# Patient Record
Sex: Female | Born: 1935 | Race: White | Hispanic: No | Smoking: Never smoker
Health system: Southern US, Community
[De-identification: ages and names within clinical notes are randomized; demographics above are authoritative.]

## PROBLEM LIST (undated history)

## (undated) DIAGNOSIS — R238 Other skin changes: Secondary | ICD-10-CM

## (undated) DIAGNOSIS — R413 Other amnesia: Secondary | ICD-10-CM

## (undated) DIAGNOSIS — J40 Bronchitis, not specified as acute or chronic: Secondary | ICD-10-CM

## (undated) DIAGNOSIS — K5790 Diverticulosis of intestine, part unspecified, without perforation or abscess without bleeding: Secondary | ICD-10-CM

## (undated) DIAGNOSIS — Z860100 Personal history of colon polyps, unspecified: Secondary | ICD-10-CM

## (undated) DIAGNOSIS — R350 Frequency of micturition: Secondary | ICD-10-CM

## (undated) DIAGNOSIS — Z9889 Other specified postprocedural states: Secondary | ICD-10-CM

## (undated) DIAGNOSIS — R42 Dizziness and giddiness: Secondary | ICD-10-CM

## (undated) DIAGNOSIS — R112 Nausea with vomiting, unspecified: Secondary | ICD-10-CM

## (undated) DIAGNOSIS — H9190 Unspecified hearing loss, unspecified ear: Secondary | ICD-10-CM

## (undated) DIAGNOSIS — C801 Malignant (primary) neoplasm, unspecified: Secondary | ICD-10-CM

## (undated) DIAGNOSIS — M549 Dorsalgia, unspecified: Secondary | ICD-10-CM

## (undated) DIAGNOSIS — F419 Anxiety disorder, unspecified: Secondary | ICD-10-CM

## (undated) DIAGNOSIS — Z8639 Personal history of other endocrine, nutritional and metabolic disease: Secondary | ICD-10-CM

## (undated) DIAGNOSIS — I10 Essential (primary) hypertension: Secondary | ICD-10-CM

## (undated) DIAGNOSIS — E039 Hypothyroidism, unspecified: Secondary | ICD-10-CM

## (undated) DIAGNOSIS — R233 Spontaneous ecchymoses: Secondary | ICD-10-CM

## (undated) HISTORY — PX: EYE SURGERY: SHX253

## (undated) HISTORY — PX: OTHER SURGICAL HISTORY: SHX169

## (undated) HISTORY — PX: TONSILLECTOMY: SUR1361

## (undated) HISTORY — PX: APPENDECTOMY: SHX54

## (undated) HISTORY — PX: CERVICAL FUSION: SHX112

## (undated) HISTORY — PX: CATARACT EXTRACTION, BILATERAL: SHX1313

## (undated) HISTORY — PX: THYROIDECTOMY, PARTIAL: SHX18

---

## 1976-06-08 HISTORY — PX: ABDOMINAL HYSTERECTOMY: SHX81

## 1997-06-08 HISTORY — PX: MASTECTOMY: SHX3

## 1997-09-04 ENCOUNTER — Ambulatory Visit (HOSPITAL_COMMUNITY): Admission: RE | Admit: 1997-09-04 | Discharge: 1997-09-04 | Payer: Self-pay | Admitting: General Surgery

## 1997-10-03 ENCOUNTER — Inpatient Hospital Stay (HOSPITAL_COMMUNITY): Admission: RE | Admit: 1997-10-03 | Discharge: 1997-10-05 | Payer: Self-pay | Admitting: Plastic Surgery

## 1998-08-29 ENCOUNTER — Ambulatory Visit (HOSPITAL_COMMUNITY): Admission: RE | Admit: 1998-08-29 | Discharge: 1998-08-29 | Payer: Self-pay | Admitting: Gastroenterology

## 1999-07-01 ENCOUNTER — Encounter: Admission: RE | Admit: 1999-07-01 | Discharge: 1999-07-01 | Payer: Self-pay | Admitting: Plastic Surgery

## 1999-07-01 ENCOUNTER — Encounter: Payer: Self-pay | Admitting: Plastic Surgery

## 1999-07-02 ENCOUNTER — Ambulatory Visit (HOSPITAL_BASED_OUTPATIENT_CLINIC_OR_DEPARTMENT_OTHER): Admission: RE | Admit: 1999-07-02 | Discharge: 1999-07-02 | Payer: Self-pay | Admitting: Plastic Surgery

## 1999-09-03 ENCOUNTER — Encounter: Payer: Self-pay | Admitting: Neurosurgery

## 1999-09-03 ENCOUNTER — Ambulatory Visit (HOSPITAL_COMMUNITY): Admission: RE | Admit: 1999-09-03 | Discharge: 1999-09-03 | Payer: Self-pay | Admitting: Neurosurgery

## 1999-11-06 ENCOUNTER — Ambulatory Visit (HOSPITAL_COMMUNITY): Admission: RE | Admit: 1999-11-06 | Discharge: 1999-11-06 | Payer: Self-pay | Admitting: Neurosurgery

## 1999-11-06 ENCOUNTER — Encounter: Payer: Self-pay | Admitting: Neurosurgery

## 1999-11-20 ENCOUNTER — Encounter: Payer: Self-pay | Admitting: Neurosurgery

## 1999-11-20 ENCOUNTER — Ambulatory Visit (HOSPITAL_COMMUNITY): Admission: RE | Admit: 1999-11-20 | Discharge: 1999-11-20 | Payer: Self-pay | Admitting: Neurosurgery

## 2000-01-15 ENCOUNTER — Encounter: Payer: Self-pay | Admitting: Neurosurgery

## 2000-01-15 ENCOUNTER — Ambulatory Visit (HOSPITAL_COMMUNITY): Admission: RE | Admit: 2000-01-15 | Discharge: 2000-01-15 | Payer: Self-pay | Admitting: Neurosurgery

## 2000-02-06 ENCOUNTER — Encounter: Payer: Self-pay | Admitting: Neurosurgery

## 2000-02-06 ENCOUNTER — Inpatient Hospital Stay (HOSPITAL_COMMUNITY): Admission: RE | Admit: 2000-02-06 | Discharge: 2000-02-08 | Payer: Self-pay | Admitting: Neurosurgery

## 2000-02-20 ENCOUNTER — Encounter: Payer: Self-pay | Admitting: Neurosurgery

## 2000-02-20 ENCOUNTER — Encounter: Admission: RE | Admit: 2000-02-20 | Discharge: 2000-02-20 | Payer: Self-pay | Admitting: Neurosurgery

## 2000-04-07 ENCOUNTER — Encounter: Admission: RE | Admit: 2000-04-07 | Discharge: 2000-04-07 | Payer: Self-pay | Admitting: Neurosurgery

## 2000-04-07 ENCOUNTER — Encounter: Payer: Self-pay | Admitting: Neurosurgery

## 2000-05-25 ENCOUNTER — Encounter: Payer: Self-pay | Admitting: *Deleted

## 2000-05-25 ENCOUNTER — Inpatient Hospital Stay (HOSPITAL_COMMUNITY): Admission: AD | Admit: 2000-05-25 | Discharge: 2000-05-27 | Payer: Self-pay | Admitting: *Deleted

## 2000-05-27 ENCOUNTER — Encounter: Payer: Self-pay | Admitting: *Deleted

## 2000-07-05 ENCOUNTER — Encounter: Payer: Self-pay | Admitting: Neurosurgery

## 2000-07-05 ENCOUNTER — Encounter: Admission: RE | Admit: 2000-07-05 | Discharge: 2000-07-05 | Payer: Self-pay | Admitting: Neurosurgery

## 2003-03-16 ENCOUNTER — Encounter: Payer: Self-pay | Admitting: Otolaryngology

## 2003-03-16 ENCOUNTER — Ambulatory Visit (HOSPITAL_COMMUNITY): Admission: RE | Admit: 2003-03-16 | Discharge: 2003-03-16 | Payer: Self-pay | Admitting: Otolaryngology

## 2011-08-20 ENCOUNTER — Other Ambulatory Visit: Payer: Self-pay | Admitting: Physician Assistant

## 2011-08-20 MED ORDER — LEVOTHYROXINE SODIUM 75 MCG PO TABS
75.0000 ug | ORAL_TABLET | Freq: Every day | ORAL | Status: DC
Start: 2011-08-20 — End: 2011-08-21

## 2011-08-21 ENCOUNTER — Other Ambulatory Visit: Payer: Self-pay

## 2011-08-21 MED ORDER — LEVOTHYROXINE SODIUM 75 MCG PO TABS: 75.0000 ug | ORAL_TABLET | Freq: Every day | ORAL | Status: DC

## 2012-01-03 ENCOUNTER — Ambulatory Visit: Payer: Medicare Other

## 2012-01-03 ENCOUNTER — Ambulatory Visit (INDEPENDENT_AMBULATORY_CARE_PROVIDER_SITE_OTHER): Payer: Medicare Other | Admitting: Emergency Medicine

## 2012-01-03 VITALS — BP 204/96 | HR 73 | Temp 98.2°F | Resp 20 | Ht 62.5 in | Wt 128.0 lb

## 2012-01-03 DIAGNOSIS — M542 Cervicalgia: Secondary | ICD-10-CM

## 2012-01-03 DIAGNOSIS — E039 Hypothyroidism, unspecified: Secondary | ICD-10-CM

## 2012-01-03 DIAGNOSIS — I10 Essential (primary) hypertension: Secondary | ICD-10-CM

## 2012-01-03 LAB — POCT CBC
Granulocyte percent: 51.5 %G (ref 37–80)
HCT, POC: 48.4 % — AB (ref 37.7–47.9)
Hemoglobin: 14.8 g/dL (ref 12.2–16.2)
Lymph, poc: 2.4 (ref 0.6–3.4)
MCH, POC: 27.7 pg (ref 27–31.2)
MCHC: 30.6 g/dL — AB (ref 31.8–35.4)
MCV: 90.6 fL (ref 80–97)
MID (cbc): 0.4 (ref 0–0.9)
MPV: 9.2 fL (ref 0–99.8)
POC Granulocyte: 2.9 (ref 2–6.9)
POC LYMPH PERCENT: 41.8 %L (ref 10–50)
POC MID %: 6.7 %M (ref 0–12)
Platelet Count, POC: 249 10*3/uL (ref 142–424)
RBC: 5.34 M/uL (ref 4.04–5.48)
RDW, POC: 15.2 %
WBC: 5.7 10*3/uL (ref 4.6–10.2)

## 2012-01-03 MED ORDER — LEVOTHYROXINE SODIUM 75 MCG PO TABS: 75.0000 ug | ORAL_TABLET | Freq: Every day | ORAL | Status: DC

## 2012-01-03 MED ORDER — RAMIPRIL 5 MG PO CAPS: 5.0000 mg | ORAL_CAPSULE | Freq: Every day | ORAL | Status: DC

## 2012-01-03 NOTE — Progress Notes (Signed)
  Subjective:    Patient ID: Sheri Shepherd, female    DOB: 10-16-1932, 78 y.o.   MRN: [id number]  HPI Patient is not taking Altace. Daughter is accompanying Pt today and agrees to set up meds for Pt weekly. Pt states she does take her Synthroid daily. Repeat BP is 180/100. Pt has very limited range of motion with her neck.     Review of Systems     Objective:   Physical Exam        Assessment & Plan:

## 2012-01-03 NOTE — Patient Instructions (Addendum)
Take 1 extra strength Tylenol twice a day. You can try heat and ice to her neck and see if that helps

## 2012-01-03 NOTE — Progress Notes (Signed)
  Subjective:    Patient ID: Sheri Shepherd, female    DOB: 1932-09-08, 78 y.o.   MRN: [id number]  HPI patient here with significant pain and swelling in the back of her neck. She has had 2 neck surgeries in the past. She has not been taking her medications. Normally her daughter puts out her medicines but recently she has been dealing with some family issues has not been able to be sure she is taking her medications appropriately.    Review of Systems     Objective:   Physical Exam there is tenderness over the posterior cervical muscles bilaterally. There is no focal weakness of the upper extremities. Her chest was clear her cardiac exam is unremarkable. She is unable to do rotation of the neck and has very limited flexion extension  Results for orders placed in visit on 01/03/12  POCT CBC      Component Value Range   WBC 5.7  4.6 - 10.2 K/uL   Lymph, poc 2.4  0.6 - 3.4   POC LYMPH PERCENT 41.8  10 - 50 %L   MID (cbc) 0.4  0 - 0.9   POC MID % 6.7  0 - 12 %M   POC Granulocyte 2.9  2 - 6.9   Granulocyte percent 51.5  37 - 80 %G   RBC 5.34  4.04 - 5.48 M/uL   Hemoglobin 14.8  12.2 - 16.2 g/dL   HCT, POC 95.6 (*) 21.3 - 47.9 %   MCV 90.6  80 - 97 fL   MCH, POC 27.7  27 - 31.2 pg   MCHC 30.6 (*) 31.8 - 35.4 g/dL   RDW, POC 08.6     Platelet Count, POC 249  142 - 424 K/uL   MPV 9.2  0 - 99.8 fL   [facility] reading (PRIMARY) by  Dr. Cleta Alberts status post fusion C5-6. There is arthritic change at C6-7 level       Assessment & Plan:  A long discussion that this patient needs to be an assisted-living. I did refill medication her daughters to come visit me to discuss this further.

## 2012-01-04 LAB — COMPREHENSIVE METABOLIC PANEL
ALT: 9 U/L (ref 0–35)
AST: 21 U/L (ref 0–37)
Albumin: 4.1 g/dL (ref 3.5–5.2)
Alkaline Phosphatase: 97 U/L (ref 39–117)
BUN: 15 mg/dL (ref 6–23)
CO2: 27 mEq/L (ref 19–32)
Calcium: 9.6 mg/dL (ref 8.4–10.5)
Chloride: 103 mEq/L (ref 96–112)
Creat: 0.87 mg/dL (ref 0.50–1.10)
Glucose, Bld: 99 mg/dL (ref 70–99)
Potassium: 4.3 mEq/L (ref 3.5–5.3)
Sodium: 140 mEq/L (ref 135–145)
Total Bilirubin: 0.5 mg/dL (ref 0.3–1.2)
Total Protein: 6.8 g/dL (ref 6.0–8.3)

## 2012-01-04 LAB — T4, FREE: Free T4: 0.79 ng/dL — ABNORMAL LOW (ref 0.80–1.80)

## 2012-01-04 LAB — TSH: TSH: 36.368 u[IU]/mL — ABNORMAL HIGH (ref 0.350–4.500)

## 2012-01-06 ENCOUNTER — Telehealth: Payer: Self-pay

## 2012-01-06 MED ORDER — TRAMADOL HCL 50 MG PO TABS: 50.0000 mg | ORAL_TABLET | Freq: Two times a day (BID) | ORAL | Status: AC

## 2012-01-06 MED ORDER — METHOCARBAMOL 500 MG PO TABS: 500.0000 mg | ORAL_TABLET | Freq: Four times a day (QID) | ORAL | Status: AC

## 2012-01-06 NOTE — Telephone Encounter (Signed)
Sent both Rxs to [facility] and notified daughter. D/W need to RTC for recheck if Sxs don't improve. Daughter agreed.

## 2012-01-06 NOTE — Telephone Encounter (Signed)
PATIENT'S DAUGHTER CALLED TO SAY HER MOTHER SAW DR. DAUB ON Sunday. HE REFILLED HER SYNTHROID AND BLOOD PRESSURE MEDICATION, HOWEVER, HE FORGOT TO GIVE HER A MUSCLE RELAXER FOR HER NECK AND (L) ARM PAIN. IT IS REALLY HURTING HER. SHE WOULD LIKE SOMETHING CALLED INTO THE PHARMACY AS SOON AS POSSIBLE FOR HER PLEASE. BEST PHONE [phone] (DAUGHTER [name] VERNON)   PHARMACY CHOICE IS [facility] ([place])   MBC

## 2012-01-06 NOTE — Telephone Encounter (Signed)
We can call in Robaxin 500 mg she can take one every 6 hours as needed for muscle relaxation #40 one refill. We can call in a TRAmadol 50mg  one twice a day for pain. Please call the daughter Garnet Koyanagi and let her know these were called in. Her daughter's number is [phone] pharmacy is [facility] in [place].

## 2012-01-06 NOTE — Telephone Encounter (Signed)
The patient's daughter called again to request something be called into the pharmacy for her mother.  The patient's daughter Rhett Bannister stated that her mother is in extreme neck and left arm pain and really needs to have this taken care of today if at all possible.  Please call the patient's daughter Rhett Bannister at [phone] when Rx has been called into the pharmacy or for any other information needed.

## 2012-01-06 NOTE — Telephone Encounter (Signed)
To MD to advise--- can we rx medicine for pain and a muscle relaxer?

## 2012-01-08 ENCOUNTER — Telehealth: Payer: Self-pay

## 2012-01-08 MED ORDER — OXYCODONE-ACETAMINOPHEN 5-325 MG PO TABS
1.0000 | ORAL_TABLET | Freq: Three times a day (TID) | ORAL | Status: DC | PRN
Start: 2012-01-08 — End: 2012-01-16

## 2012-01-08 NOTE — Telephone Encounter (Signed)
Pts daughter (and POA) [name] Marita [place] calling for mother's pain. States Daub rx'd muscle relaxer and tramadol for a cervical disc issue. States the tramadol plus tylenol is not helping. States in the past dr Cleta Alberts has rx'd percocet and that was helpful for pain management. Can percocet be rx'd again?   [name] [phone]  bf

## 2012-01-08 NOTE — Telephone Encounter (Signed)
Tramadol is not helping with her neck and arm pain. Has history of c spine surgery 10 yrs ago and is requesting Percocet for her pain (helped then). She is c/o severe pain and states Dr Cleta Alberts is very familiar with her. I advised daughter this mssg to be sent to Dr Cleta Alberts. Daughter has asked if Dr Perrin Maltese will advise, since Dr Cleta Alberts is out of office. Please advise.

## 2012-01-08 NOTE — Telephone Encounter (Signed)
Can percocet be prescribed or could you give advice on what we should do.

## 2012-01-08 NOTE — Telephone Encounter (Signed)
Daughter [name] notified that rx is ready for pickup

## 2012-01-08 NOTE — Telephone Encounter (Signed)
Pt daughter Rhett Bannister would like for nurse to contact about her mom chronic pain she states pt has been a pt of Dr Cleta Alberts for along time please contact Mrs. Marita [place] @ [phone]

## 2012-01-08 NOTE — Telephone Encounter (Signed)
Please pull chart and then forward to Dr. Cleta Alberts for review.

## 2012-01-11 ENCOUNTER — Telehealth: Payer: Self-pay

## 2012-01-11 DIAGNOSIS — M542 Cervicalgia: Secondary | ICD-10-CM

## 2012-01-11 NOTE — Telephone Encounter (Signed)
I am keeping this in my box so I can call her mid week and see if appt information rc'd from Dr Venetia Maxon or if I need to follow up/ Dr Cleta Alberts stated he may be able to order scan if we can not get in quickly with Dr Venetia Maxon.

## 2012-01-11 NOTE — Telephone Encounter (Signed)
Have spoken to [facility] pt is to be set up for appointment with Dr Venetia Maxon, and is not getting relief with the Oxycodone. I told her we will try to get the appt set up with Dr Venetia Maxon soon

## 2012-01-11 NOTE — Telephone Encounter (Signed)
FOR DR DAUB ONLY:  [name] WOULD LIKE TO SPEAK WITH YOU REGARDING HER MOM. SHE IS WORRIED ABOUT HER AND DIDN'T KNOW IF THEY NEED TO DO AND MRI HAVE SPOKEN WITH SOME OF THE NURSES HERE, BUT REALLY WOULD LIKE A CALL BACK FROM YOU PLEASE CALL [phone]

## 2012-01-12 NOTE — Telephone Encounter (Signed)
I have spoken to Dr Cleta Alberts and will be a while before the appt with Dr Venetia Maxon can be made. He advised we can proceed with the MRI of her C spine this is ordered for her, daughter [name] advised the percocet helps some she is taking one every 8 hours and would like to get another Rx for this. Please advise. Also would like to know if any studies can be done for her mothers memory issues she is having.

## 2012-01-12 NOTE — Telephone Encounter (Signed)
Let's go 1 step at a time. Okay to wear the MRI of the C-spine. Once this is done if her daughter desires we can refer her to a neurologist to have the testing done for dementia.

## 2012-01-13 ENCOUNTER — Telehealth: Payer: Self-pay | Admitting: Radiology

## 2012-01-13 NOTE — Telephone Encounter (Signed)
Patients daughter was advised of our conversation about neurology and neurosurgeon, her mother is asking for renewal on the Percocet taking 1 tab every 8 hrs and needs renewal please advise.

## 2012-01-13 NOTE — Telephone Encounter (Signed)
Left detailed message for her to advise will take one step at a time and keep this referral in mind. Have advised if further is needed she will call us back.

## 2012-01-14 ENCOUNTER — Other Ambulatory Visit: Payer: Self-pay

## 2012-01-14 ENCOUNTER — Telehealth: Payer: Self-pay | Admitting: Radiology

## 2012-01-14 ENCOUNTER — Emergency Department (HOSPITAL_COMMUNITY): Payer: Medicare Other

## 2012-01-14 ENCOUNTER — Inpatient Hospital Stay (HOSPITAL_COMMUNITY)
Admission: EM | Admit: 2012-01-14 | Discharge: 2012-01-16 | DRG: 392 | Disposition: A | Discharge status: Home / Self Care | Payer: Medicare Other | Attending: Internal Medicine | Admitting: Internal Medicine

## 2012-01-14 ENCOUNTER — Other Ambulatory Visit (HOSPITAL_COMMUNITY): Payer: Medicare Other

## 2012-01-14 ENCOUNTER — Encounter (HOSPITAL_COMMUNITY): Payer: Self-pay | Admitting: Emergency Medicine

## 2012-01-14 DIAGNOSIS — E039 Hypothyroidism, unspecified: Secondary | ICD-10-CM

## 2012-01-14 DIAGNOSIS — Z981 Arthrodesis status: Secondary | ICD-10-CM

## 2012-01-14 DIAGNOSIS — K5790 Diverticulosis of intestine, part unspecified, without perforation or abscess without bleeding: Secondary | ICD-10-CM | POA: Diagnosis present

## 2012-01-14 DIAGNOSIS — R109 Unspecified abdominal pain: Secondary | ICD-10-CM | POA: Diagnosis present

## 2012-01-14 DIAGNOSIS — Z860100 Personal history of colon polyps, unspecified: Secondary | ICD-10-CM | POA: Insufficient documentation

## 2012-01-14 DIAGNOSIS — I10 Essential (primary) hypertension: Secondary | ICD-10-CM | POA: Diagnosis present

## 2012-01-14 DIAGNOSIS — M542 Cervicalgia: Secondary | ICD-10-CM | POA: Diagnosis present

## 2012-01-14 DIAGNOSIS — K5732 Diverticulitis of large intestine without perforation or abscess without bleeding: Principal | ICD-10-CM | POA: Diagnosis present

## 2012-01-14 DIAGNOSIS — R112 Nausea with vomiting, unspecified: Secondary | ICD-10-CM | POA: Diagnosis present

## 2012-01-14 DIAGNOSIS — M4802 Spinal stenosis, cervical region: Secondary | ICD-10-CM

## 2012-01-14 DIAGNOSIS — K5730 Diverticulosis of large intestine without perforation or abscess without bleeding: Secondary | ICD-10-CM

## 2012-01-14 HISTORY — DX: Other specified postprocedural states: Z98.890

## 2012-01-14 HISTORY — DX: Malignant (primary) neoplasm, unspecified: C80.1

## 2012-01-14 HISTORY — DX: Personal history of other endocrine, nutritional and metabolic disease: Z86.39

## 2012-01-14 HISTORY — DX: Personal history of colonic polyps: Z86.010

## 2012-01-14 HISTORY — DX: Essential (primary) hypertension: I10

## 2012-01-14 HISTORY — DX: Nausea with vomiting, unspecified: R11.2

## 2012-01-14 HISTORY — DX: Personal history of colon polyps, unspecified: Z86.0100

## 2012-01-14 HISTORY — DX: Dorsalgia, unspecified: M54.9

## 2012-01-14 HISTORY — DX: Hypothyroidism, unspecified: E03.9

## 2012-01-14 LAB — CBC
MCH: 28.9 pg (ref 26.0–34.0)
MCHC: 33.9 g/dL (ref 30.0–36.0)
MCV: 85.2 fL (ref 78.0–100.0)
Platelets: 213 10*3/uL (ref 150–400)

## 2012-01-14 LAB — URINALYSIS, ROUTINE W REFLEX MICROSCOPIC
Glucose, UA: NEGATIVE mg/dL
Hgb urine dipstick: NEGATIVE
Ketones, ur: NEGATIVE mg/dL
Protein, ur: NEGATIVE mg/dL
Urobilinogen, UA: 1 mg/dL (ref 0.0–1.0)

## 2012-01-14 LAB — BASIC METABOLIC PANEL
Calcium: 9.4 mg/dL (ref 8.4–10.5)
Creatinine, Ser: 0.81 mg/dL (ref 0.50–1.10)
GFR calc non Af Amer: 69 mL/min — ABNORMAL LOW (ref >90)
Glucose, Bld: 144 mg/dL — ABNORMAL HIGH (ref 70–99)
Sodium: 135 mEq/L (ref 135–145)

## 2012-01-14 LAB — LIPASE, BLOOD: Lipase: 20 U/L (ref 11–59)

## 2012-01-14 MED ORDER — ONDANSETRON HCL 4 MG/2ML IJ SOLN
4.0000 mg | Freq: Once | INTRAMUSCULAR | Status: AC
  Administered 2012-01-14: 4 mg via INTRAVENOUS
  Filled 2012-01-14: qty 2

## 2012-01-14 MED ORDER — MORPHINE SULFATE 2 MG/ML IJ SOLN
INTRAMUSCULAR | Status: AC
  Administered 2012-01-14: 2 mg via INTRAVENOUS
  Filled 2012-01-14: qty 1

## 2012-01-14 MED ORDER — ACETAMINOPHEN 325 MG PO TABS
650.0000 mg | ORAL_TABLET | Freq: Four times a day (QID) | ORAL | Status: DC | PRN
  Administered 2012-01-14: 650 mg via ORAL
  Filled 2012-01-14: qty 2

## 2012-01-14 MED ORDER — MORPHINE SULFATE 4 MG/ML IJ SOLN
4.0000 mg | Freq: Once | INTRAMUSCULAR | Status: AC
  Administered 2012-01-14: 4 mg via INTRAVENOUS
  Filled 2012-01-14: qty 1

## 2012-01-14 MED ORDER — BISACODYL 10 MG RE SUPP: 10.0000 mg | Freq: Every day | RECTAL | Status: DC | PRN

## 2012-01-14 MED ORDER — CIPROFLOXACIN IN D5W 400 MG/200ML IV SOLN
400.0000 mg | Freq: Two times a day (BID) | INTRAVENOUS | Status: DC
  Administered 2012-01-14 – 2012-01-16 (×4): 400 mg via INTRAVENOUS
  Filled 2012-01-14 (×4): qty 200

## 2012-01-14 MED ORDER — SODIUM CHLORIDE 0.9 % IV SOLN
INTRAVENOUS | Status: AC
  Administered 2012-01-14: 15:00:00 via INTRAVENOUS

## 2012-01-14 MED ORDER — ONDANSETRON HCL 4 MG/2ML IJ SOLN
4.0000 mg | INTRAMUSCULAR | Status: DC | PRN
  Administered 2012-01-15: 4 mg via INTRAVENOUS
  Filled 2012-01-14: qty 2

## 2012-01-14 MED ORDER — METRONIDAZOLE IN NACL 5-0.79 MG/ML-% IV SOLN
500.0000 mg | Freq: Three times a day (TID) | INTRAVENOUS | Status: DC
  Administered 2012-01-14 – 2012-01-16 (×5): 500 mg via INTRAVENOUS
  Filled 2012-01-14 (×6): qty 100

## 2012-01-14 MED ORDER — MORPHINE SULFATE 2 MG/ML IJ SOLN
1.0000 mg | INTRAMUSCULAR | Status: DC | PRN
  Administered 2012-01-15: 2 mg via INTRAVENOUS
  Administered 2012-01-15: 1 mg via INTRAVENOUS
  Administered 2012-01-15 – 2012-01-16 (×3): 2 mg via INTRAVENOUS
  Filled 2012-01-14 (×5): qty 1

## 2012-01-14 MED ORDER — RAMIPRIL 5 MG PO CAPS
5.0000 mg | ORAL_CAPSULE | Freq: Every day | ORAL | Status: DC
  Administered 2012-01-14 – 2012-01-16 (×3): 5 mg via ORAL
  Filled 2012-01-14 (×3): qty 1

## 2012-01-14 MED ORDER — CIPROFLOXACIN HCL 500 MG PO TABS
500.0000 mg | ORAL_TABLET | Freq: Once | ORAL | Status: AC
  Administered 2012-01-14: 500 mg via ORAL
  Filled 2012-01-14: qty 1

## 2012-01-14 MED ORDER — MORPHINE SULFATE 4 MG/ML IJ SOLN: 4.0000 mg | INTRAMUSCULAR | Status: DC | PRN

## 2012-01-14 MED ORDER — LEVOTHYROXINE SODIUM 75 MCG PO TABS
75.0000 ug | ORAL_TABLET | Freq: Every day | ORAL | Status: DC
  Administered 2012-01-15 – 2012-01-16 (×2): 75 ug via ORAL
  Filled 2012-01-14 (×4): qty 1

## 2012-01-14 MED ORDER — IOHEXOL 300 MG/ML  SOLN
100.0000 mL | Freq: Once | INTRAMUSCULAR | Status: AC | PRN
  Administered 2012-01-14: 100 mL via INTRAVENOUS

## 2012-01-14 MED ORDER — ENOXAPARIN SODIUM 40 MG/0.4ML ~~LOC~~ SOLN
40.0000 mg | SUBCUTANEOUS | Status: DC
  Administered 2012-01-14 – 2012-01-15 (×2): 40 mg via SUBCUTANEOUS
  Filled 2012-01-14 (×3): qty 0.4

## 2012-01-14 MED ORDER — SODIUM CHLORIDE 0.9 % IV SOLN
INTRAVENOUS | Status: DC
Start: 2012-01-14 — End: 2012-01-16
  Administered 2012-01-15: 1000 mL via INTRAVENOUS
  Administered 2012-01-15 – 2012-01-16 (×2): via INTRAVENOUS

## 2012-01-14 MED ORDER — METRONIDAZOLE 500 MG PO TABS
500.0000 mg | ORAL_TABLET | Freq: Once | ORAL | Status: AC
  Administered 2012-01-14: 500 mg via ORAL
  Filled 2012-01-14: qty 1

## 2012-01-14 MED ORDER — ONDANSETRON HCL 4 MG/2ML IJ SOLN
4.0000 mg | Freq: Three times a day (TID) | INTRAMUSCULAR | Status: DC | PRN
  Administered 2012-01-14: 4 mg via INTRAVENOUS
  Filled 2012-01-14: qty 2

## 2012-01-14 MED ORDER — ASPIRIN BUFFERED 325 MG PO TABS: 325.0000 mg | ORAL_TABLET | Freq: Every day | ORAL | Status: DC

## 2012-01-14 MED ORDER — ACETAMINOPHEN 650 MG RE SUPP: 650.0000 mg | Freq: Four times a day (QID) | RECTAL | Status: DC | PRN

## 2012-01-14 MED ORDER — ONDANSETRON HCL 4 MG/2ML IJ SOLN
4.0000 mg | Freq: Once | INTRAMUSCULAR | Status: AC
Start: 2012-01-14 — End: 2012-01-14
  Administered 2012-01-14: 4 mg via INTRAVENOUS
  Filled 2012-01-14: qty 2

## 2012-01-14 MED ORDER — METHOCARBAMOL 500 MG PO TABS
500.0000 mg | ORAL_TABLET | Freq: Three times a day (TID) | ORAL | Status: DC
  Administered 2012-01-15 – 2012-01-16 (×5): 500 mg via ORAL
  Filled 2012-01-14 (×8): qty 1

## 2012-01-14 MED ORDER — SENNA 8.6 MG PO TABS
1.0000 | ORAL_TABLET | Freq: Two times a day (BID) | ORAL | Status: DC
  Administered 2012-01-14 – 2012-01-16 (×4): 8.6 mg via ORAL
  Filled 2012-01-14 (×4): qty 1

## 2012-01-14 MED ORDER — ASPIRIN EC 325 MG PO TBEC
325.0000 mg | DELAYED_RELEASE_TABLET | Freq: Every day | ORAL | Status: DC
  Administered 2012-01-15 – 2012-01-16 (×2): 325 mg via ORAL
  Filled 2012-01-14 (×2): qty 1

## 2012-01-14 MED ORDER — ONDANSETRON HCL 4 MG PO TABS: 4.0000 mg | ORAL_TABLET | ORAL | Status: DC | PRN

## 2012-01-14 NOTE — ED Notes (Signed)
Per EMS pt woke this am with lower abd discomfort with nausea but no vomiting  No bowel changes noted  No hx of abd problems

## 2012-01-14 NOTE — Telephone Encounter (Signed)
We can refill once she has her GI eval.

## 2012-01-14 NOTE — Telephone Encounter (Signed)
Just let Sheri Shepherd know we will do the rest of her evaluation after she recovers from her GI problems

## 2012-01-14 NOTE — Telephone Encounter (Signed)
Daughter called, mom is in hospital and wanted to talk to Amy L or Dr. Cleta Alberts. [phone] [name] cell.

## 2012-01-14 NOTE — Telephone Encounter (Signed)
Patient is in [facility] for evaluation of her abdominal pain, ?diverticulitis patient was scheduled for her MRI C spine today has been CXL.due to this. I have spoken to daughter [name] and she just wants you to know and to let her know if anything else can be reccommended. Daughter [name] [phone]

## 2012-01-14 NOTE — H&P (Addendum)
[facility] History and Physical  Sheri Shepherd OZH:[id number] DOB: Feb 16, 1933 DOA: 01/14/2012  Referring physician PCP: Lucilla Edin, MD   Chief Complaint: Abdominal pain  HPI:  The patient is a 78 year old female with past medical history significant for diverticulosis, hypothyroidism, hypertension and history of colonic polyps who presents with above complaints. She states that for the past 2 weeks she's had severe neck pain (left side) radiating to her left upper arm and has been requiring increased narcotics for pain control, and reports she has had some constipation. Today she began having severe left lower question pain-crampy and associated with nausea. Her daughter reports that she was writhing in pain when she called her at 5 AM today and was sweaty. She was brought to the ED.She reports she vomitedx1 in the ED, nonbloody. She denies diarrhea. She states that she had a bowel movement times one in the ED -hard/constipated. In the ED she had a CT scan of the abdomen and pelvis done which showed scattered colonic diverticula but without compelling findings of acute diverticulitis. She was empirically started on Cipro Flagyl and admitted for further evaluation and management. The patient's daughter reports that her PCP had ordered an MRI of her neck because of the severe pain she been having for the past 2 weeks in her neck and that MRI was scheduled for today and so she requested that it be done in the ED and this was done and adjacent segment disease at C4-5 contributing to moderate spinal stenosis with effacement of CSF surrounding the cord there was no abnormal cord signal there was moderate foramina are stenosis bilaterally worse on the left. Patient reports that she had surgery in the past per Dr. Venetia Maxon and has been trying to get an appointment to followup with him. She denies any weakness in her upper extremities.  Review of Systems:  The patient denies anorexia, fever, weight  loss,, vision loss, decreased hearing, hoarseness, chest pain, syncope, dyspnea on exertion, peripheral edema, balance deficits, hemoptysis, melena, hematochezia, severe indigestion/heartburn, hematuria, incontinence, genital sores, muscle weakness, suspicious skin lesions, transient blindness, difficulty walking, depression, unusual weight change, abnormal bleeding, enlarged lymph nodes, angioedema, and breast masses.   Past Medical History  Diagnosis Date  . Hypertension   . Hypothyroid   . Back pain   . Cancer     Breast   . PONV (postoperative nausea and vomiting)   . History of colonic polyps   . Hx of thyroid nodule    Past Surgical History  Procedure Date  . Cervical fusion     x2  . Mastectomy 1999    Left  . Thyroidectomy, partial   . Abdominal hysterectomy 1978  . Colonoscopy   . Cataract extraction, bilateral   . Tonsillectomy    Social History:  reports that she has never smoked. She has never used smokeless tobacco. She reports that she does not drink alcohol or use illicit drugs.  where does patient live--home alone Can patient participate in ADLs-yes  Allergies  Allergen Reactions  . Lyrica (Pregabalin)   . Penicillins Other (See Comments)  . Prednisone     Elevated heartrate.  . Sulfa Antibiotics Rash    Red, swollen areas as well.Current Outpatient Prescriptions on File Prior to Visit: levothyroxine (SYNTHROID, LEVOTHROID) 75 MCG tablet, Take 1 tablet (75 mcg total) by mouth daily. Needs office visit with labs., Disp: 30 tablet, Rfl: 3 Red swollen areas as well.      History reviewed. No  pertinent family history.   Prior to Admission medications   Medication Sig Start Date End Date Taking? Authorizing Provider  aspirin 325 MG buffered tablet Take 325 mg by mouth as needed.   Yes Historical Provider, MD  levothyroxine (SYNTHROID, LEVOTHROID) 75 MCG tablet Take 1 tablet (75 mcg total) by mouth daily. Needs office visit with labs. 01/03/12 01/02/13 Yes  Collene Gobble, MD  methocarbamol (ROBAXIN) 500 MG tablet Take 1 tablet (500 mg total) by mouth 4 (four) times daily. 01/06/12 01/16/12 Yes Collene Gobble, MD  Multiple Vitamin (MULTIVITAMIN WITH MINERALS) TABS Take 1 tablet by mouth daily.   Yes Historical Provider, MD  oxyCODONE-acetaminophen (ROXICET) 5-325 MG per tablet Take 1 tablet by mouth every 8 (eight) hours as needed for pain. 01/08/12 01/18/12 Yes Collene Gobble, MD  ramipril (ALTACE) 5 MG capsule Take 1 capsule (5 mg total) by mouth daily. 01/03/12 01/02/13 Yes Collene Gobble, MD  traMADol (ULTRAM) 50 MG tablet Take 1 tablet (50 mg total) by mouth 2 (two) times daily. 01/06/12 01/16/12 Yes Collene Gobble, MD   Physical Exam: Filed Vitals:   01/14/12 1217 01/14/12 1605 01/14/12 1652 01/14/12 1700  BP: 142/72 133/59 176/79 185/74  Pulse: 67 57 58 75  Temp: 98.5 F (36.9 C) 97.9 F (36.6 C) 97.4 F (36.3 C) 98 F (36.7 C)  TempSrc: Oral Oral Oral   Resp:  18  18  Height:    5\' 4"  (1.626 m)  Weight:    57.6 kg (126 lb 15.8 oz)  SpO2: 97% 97% 100% 99%    Constitutional: Vital signs reviewed.  Patient is an elderly well-developed female in no acute distress and cooperative with exam. Alert and oriented x3.  Head: Normocephalic and atraumatic Mouth: no erythema or exudates, slightly dry mucous membranes Eyes: PERRL, EOMI, conjunctivae normal, No scleral icterus.  Neck: Neck tender on the left, Supple, Trachea midline normal ROM, No JVD, mass, thyromegaly, or carotid bruit present.  Cardiovascular: RRR, S1 normal, S2 normal, no MRG, pulses symmetric and intact bilaterally Pulmonary/Chest: CTAB, no wheezes, rales, or rhonchi Abdominal: Left lower quadrant tenderness, no rebound, nondistended, bowel sounds are normal, no masses, organomegaly, or guarding present.  Extremities: No cyanosis and no edema Neurological: A&O x3, Strength is normal and symmetric bilaterally, cranial nerve II-XII are grossly intact, no focal motor deficit, sensory  intact to light touch bilaterally.   Psychiatric: Normal mood and affect. speech and behavior is normal. Judgment and thought content normal. Cognition and memory are normal.    Labs on Admission:  Basic Metabolic Panel:  Lab 01/14/12 1610  NA 135  K 3.6  CL 97  CO2 28  GLUCOSE 144*  BUN 11  CREATININE 0.81  CALCIUM 9.4  MG --  PHOS --   Liver Function Tests: No results found for this basename: AST:5,ALT:5,ALKPHOS:5,BILITOT:5,PROT:5,ALBUMIN:5 in the last 168 hours  Lab 01/14/12 0801  LIPASE 20  AMYLASE --   No results found for this basename: AMMONIA:5 in the last 168 hours CBC:  Lab 01/14/12 0801  WBC 10.1  NEUTROABS --  HGB 15.1*  HCT 44.5  MCV 85.2  PLT 213   Cardiac Enzymes: No results found for this basename: CKTOTAL:5,CKMB:5,CKMBINDEX:5,TROPONINI:5 in the last 168 hours  BNP (last 3 results) No results found for this basename: PROBNP:3 in the last 8760 hours CBG: No results found for this basename: GLUCAP:5 in the last 168 hours  Radiological Exams on Admission: Mr Cervical Spine Wo Contrast  01/14/2012  *RADIOLOGY  REPORT*  Clinical Data: Emergency Room patient with neck and left shoulder pain for 3 weeks.  History of cervical fusion 10 years ago.  Remote history of breast cancer.  MRI CERVICAL SPINE WITHOUT CONTRAST  Technique:  Multiplanar and multiecho pulse sequences of the cervical spine, to include the craniocervical junction and cervicothoracic junction, were obtained according to standard protocol without intravenous contrast.  Comparison: Radiographs 01/03/2012.  Report from the cervical MRI 09/03/1999.  Findings: There is a mildly exaggerated cervical lordosis and thoracic kyphosis status post C5-C6 ACDF.  There is no listhesis or evidence of acute fracture.  There are no paraspinal abnormalities or evidence of metastatic disease.  The cervical cord is normal in signal and caliber.  Mild diffuse intracranial atrophy is noted.  There are bilateral  vertebral artery flow voids.  There is asymmetric enlargement of the left thyroid lobe as previously reported.  The patient is status post right thyroidectomy.  There are no significant findings at C1-C2 or C2-C3.  C3-C4:  Disc height is maintained.  There is asymmetric uncinate spurring and facet hypertrophy on the right contributing to moderate right foraminal stenosis.  There is no cord deformity.  C4-C5:  There is adjacent segment disease with posterior osteophytes covering diffusely bulging disc material.  There is moderate bilateral facet hypertrophy.  The AP diameter of the canal is narrowed to 7 mm.  There is moderate foraminal narrowing bilaterally, worse on the left.  C5-C6:  There is solid interbody and facet joint effusion status post ACDF.  No significant spinal stenosis is seen.  C6-C7:  Disc bulging osteophytes are asymmetric to the right. There is mild bilateral facet hypertrophy.  The right foramen appears mildly narrowed.  No cord deformity or significant left- sided foraminal compromise is evident.  C7-T1:  Mild bilateral facet hypertrophy.  No significant spinal stenosis or nerve root encroachment.  IMPRESSION:  1.  No acute findings demonstrated status post C5-C6 ACDF.  2.  Adjacent segment disease at C4-C5 contributes to moderate spinal stenosis with effacement of the CSF surrounding the cord. There is no abnormal cord signal.  There is moderate foraminal stenosis bilaterally, worse on the left. 3.  Lesser spondylosis at the additional levels as detailed above.  Original Report Authenticated By: Gerrianne Scale, M.D.   Ct Abdomen Pelvis W Contrast  01/14/2012  *RADIOLOGY REPORT*  Clinical Data: Lower abdominal pain.  Nausea and vomiting.  Chronic back pain.  CT ABDOMEN AND PELVIS WITH CONTRAST  Technique:  Multidetector CT imaging of the abdomen and pelvis was performed following the standard protocol during bolus administration of intravenous contrast.  Contrast: OMNIPAQUE IOHEXOL  300 MG/ML  SOLN  Comparison: None.  Findings: Linear subsegmental atelectasis or scarring noted in the left lower lobe.  Inferior margin of a left breast implant noted.  Elevated left hemidiaphragm noted. The liver, spleen, pancreas, and adrenal glands appear unremarkable.  The gallbladder and biliary system appear unremarkable.  Wall thickening in the stomach antrum is likely due to peristalsis.  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  Left kidney appears unremarkable.  Aortoiliac atherosclerotic calcification noted. No pathologic retroperitoneal or porta hepatis adenopathy is identified.  No pathologic pelvic adenopathy is identified.  Urinary bladder appears unremarkable.  Uterus is absent.  Scattered small diverticula of the ascending colon are identified along with scattered sigmoid colon diverticula, but without compelling findings of acute diverticulitis.  No dilated small bowel is observed.  The appendix  is not well seen.  Facet arthropathy is present at L4-5 and L5-S1, with grade 1 degenerative anterolisthesis of L4 on L5.  No free pelvic fluid is observed.  IMPRESSION:  1.  Scattered colonic diverticula, but without compelling findings of acute diverticulitis. 2.  Lower lumbar spondylosis. 3.  Partially duplicated right collecting system. 4.  Atherosclerosis. 5.  Prior hysterectomy. 6.  Elevated left hemidiaphragm  Original Report Authenticated By: Dellia Cloud, M.D.      Assessment/Plan Active Problems:  Abdominal pain -As discussed above-physical exam findings consistent with diverticulitis and she does have  colonic diverticula but per CT no compelling findings for diverticulitis -urinalysis is negative, and lipase within normal limits -Will continue empiric Cipro Flagyl, follow and consult GI for further recommendations, will keep n.p.o. For now hydrate with IV fluids -Patient also with constipation which might be  constipating factor-will place on Senokot and when necessary Dulcolax and follow. -Obtain followup abdominal x-rays in the a.m.  Nausea & vomiting -Likely secondary to above, treatment as above follow. Supportive care.  HTN (hypertension) -Continue outpatient medications.  Diverticulosis  Neck pain -As discussed above, pain management, continue muscle relaxants. will consult Dr. Holley Raring in am for further recommendations. Hypothyroidism -continue synthroid   Code Status: full Family Communication: daughter at bedside Disposition Plan: admit to floor  Time spent: >23mins  Kela Millin [facility] Pager [phone]  If 7PM-7AM, please contact night-coverage [email or web address] Password [facility] 01/14/2012, 7:45 PM

## 2012-01-14 NOTE — Progress Notes (Signed)
Sheri Shepherd, is a 78 y.o. female,   MRN: [id number]  -  DOB - [birth year]  Outpatient Primary MD for the patient is DAUB, Stan Head, MD  in for    Chief Complaint  Patient presents with  . Abdominal Pain     Blood pressure 142/72, pulse 67, temperature 98.5 F (36.9 C), temperature source Oral, resp. rate 20, SpO2 97.00%.  Active Problems:  Abdominal pain  Nausea & vomiting  HTN (hypertension)  Personal history of colonic polyps  Diverticulosis   78 yo hx diverticulosis, polyps, HTN chronic back pain, cervical pain presents to Ed with CC abdominal pain and persistent nausea vomiting. Daughter reports N/V started early am this am. Pt not feeling well lately due to neck pain and has not been eating usual amount. Vomited small amounts clear to brownish thin emesis several times at home. Pt reports abdominal pain cramplike in lower quadrants. Denies bldy stools but does report some loose stool   In ed CT abd/pelvis yields Scattered colonic diverticula, but without compelling findings of acute diverticulitis.  Lower lumbar spondylosis. Partially duplicated right collecting system.  Atherosclerosis. Other lab work only significant for Hg 15.1. Pt continues to vomit in ED. Given morphine and zofran in ED with little relief. Also started on cipro and flagyl for early diverticulitis.   Admit to medical bed.

## 2012-01-14 NOTE — ED Provider Notes (Signed)
History     CSN: [id number]  Arrival date & time 01/14/12  0719   First MD Initiated Contact with Patient 01/14/12 0732      Chief Complaint  Patient presents with  . Abdominal Pain     HPI The patient reports developing nausea vomiting left lower quadrant abdominal pain through the night.  She has a history of diverticulosis.  She denies constipation diarrhea.  She's had nausea and vomiting.  She denies hematemesis.  She's had no melena or hematochezia.  She has no dysuria or urinary frequency.  She's been started with neck pain over the past several weeks and had an outpatient MRI of her cervical spine scheduled for today given the fact that she has had radiating pain down her left arm.  She denies weakness of her upper lower extremities.  Family requests that we also completed an MRI of her cervical spine today.  Her pain is mild to moderate at this time.  Nothing worsens her pain except from a push on her abdomen.  Nothing improves her pain.  Her pain is constant   Past Medical History  Diagnosis Date  . Hypertension   . Hypothyroid   . Back pain   . Cancer     Breast   . PONV (postoperative nausea and vomiting)   . History of colonic polyps   . Hx of thyroid nodule     Past Surgical History  Procedure Date  . Cervical fusion     x2  . Mastectomy 1999    Left  . Thyroidectomy, partial   . Abdominal hysterectomy 1978  . Colonoscopy   . Cataract extraction, bilateral   . Tonsillectomy     History reviewed. No pertinent family history.  History  Substance Use Topics  . Smoking status: Never Smoker   . Smokeless tobacco: Never Used  . Alcohol Use: No    OB History    Grav Para Term Preterm Abortions TAB SAB Ect Mult Living                  Review of Systems  All other systems reviewed and are negative.    Allergies  Lyrica; Penicillins; Prednisone; and Sulfa antibiotics  Home Medications   Current Outpatient Rx  Name Route Sig Dispense Refill  .  ASPIRIN BUFFERED 325 MG PO TABS Oral Take 325 mg by mouth as needed.    Marland Kitchen LEVOTHYROXINE SODIUM 75 MCG PO TABS Oral Take 1 tablet (75 mcg total) by mouth daily. Needs office visit with labs. 30 tablet 11  . METHOCARBAMOL 500 MG PO TABS Oral Take 1 tablet (500 mg total) by mouth 4 (four) times daily. 40 tablet 1  . ADULT MULTIVITAMIN W/MINERALS CH Oral Take 1 tablet by mouth daily.    . OXYCODONE-ACETAMINOPHEN 5-325 MG PO TABS Oral Take 1 tablet by mouth every 8 (eight) hours as needed for pain. 20 tablet 0  . RAMIPRIL 5 MG PO CAPS Oral Take 1 capsule (5 mg total) by mouth daily. 30 capsule 11  . TRAMADOL HCL 50 MG PO TABS Oral Take 1 tablet (50 mg total) by mouth 2 (two) times daily. 20 tablet 1    BP 142/72  Pulse 67  Temp 98.5 F (36.9 C) (Oral)  Resp 20  SpO2 97%  Physical Exam  Nursing note and vitals reviewed. Constitutional: She is oriented to person, place, and time. She appears well-developed and well-nourished. No distress.  HENT:  Head: Normocephalic and atraumatic.  Eyes:  EOM are normal.  Neck: Normal range of motion.       The cervical spine tenderness or step-offs.  Cardiovascular: Normal rate, regular rhythm and normal heart sounds.   Pulmonary/Chest: Effort normal and breath sounds normal.  Abdominal: Soft. She exhibits no distension.       Mild left lower quadrant tenderness without guarding or rebound.  No peritonitis  Musculoskeletal: Normal range of motion.       5 out of 5 strength in bilateral upper extremity major muscle groups  Neurological: She is alert and oriented to person, place, and time.  Skin: Skin is warm and dry.  Psychiatric: She has a normal mood and affect. Judgment normal.    ED Course  Procedures (including critical care time)  Labs Reviewed  CBC - Abnormal; Notable for the following:    RBC 5.22 (*)     Hemoglobin 15.1 (*)     All other components within normal limits  BASIC METABOLIC PANEL - Abnormal; Notable for the following:     Glucose, Bld 144 (*)     GFR calc non Af Amer 69 (*)     GFR calc Af Amer 80 (*)     All other components within normal limits  URINALYSIS, ROUTINE W REFLEX MICROSCOPIC   Mr Cervical Spine Wo Contrast  01/14/2012  *RADIOLOGY REPORT*  Clinical Data: Emergency Room patient with neck and left shoulder pain for 3 weeks.  History of cervical fusion 10 years ago.  Remote history of breast cancer.  MRI CERVICAL SPINE WITHOUT CONTRAST  Technique:  Multiplanar and multiecho pulse sequences of the cervical spine, to include the craniocervical junction and cervicothoracic junction, were obtained according to standard protocol without intravenous contrast.  Comparison: Radiographs 01/03/2012.  Report from the cervical MRI 09/03/1999.  Findings: There is a mildly exaggerated cervical lordosis and thoracic kyphosis status post C5-C6 ACDF.  There is no listhesis or evidence of acute fracture.  There are no paraspinal abnormalities or evidence of metastatic disease.  The cervical cord is normal in signal and caliber.  Mild diffuse intracranial atrophy is noted.  There are bilateral vertebral artery flow voids.  There is asymmetric enlargement of the left thyroid lobe as previously reported.  The patient is status post right thyroidectomy.  There are no significant findings at C1-C2 or C2-C3.  C3-C4:  Disc height is maintained.  There is asymmetric uncinate spurring and facet hypertrophy on the right contributing to moderate right foraminal stenosis.  There is no cord deformity.  C4-C5:  There is adjacent segment disease with posterior osteophytes covering diffusely bulging disc material.  There is moderate bilateral facet hypertrophy.  The AP diameter of the canal is narrowed to 7 mm.  There is moderate foraminal narrowing bilaterally, worse on the left.  C5-C6:  There is solid interbody and facet joint effusion status post ACDF.  No significant spinal stenosis is seen.  C6-C7:  Disc bulging osteophytes are asymmetric to the  right. There is mild bilateral facet hypertrophy.  The right foramen appears mildly narrowed.  No cord deformity or significant left- sided foraminal compromise is evident.  C7-T1:  Mild bilateral facet hypertrophy.  No significant spinal stenosis or nerve root encroachment.  IMPRESSION:  1.  No acute findings demonstrated status post C5-C6 ACDF.  2.  Adjacent segment disease at C4-C5 contributes to moderate spinal stenosis with effacement of the CSF surrounding the cord. There is no abnormal cord signal.  There is moderate foraminal stenosis bilaterally, worse on the  left. 3.  Lesser spondylosis at the additional levels as detailed above.  Original Report Authenticated By: Gerrianne Scale, M.D.   Ct Abdomen Pelvis W Contrast  01/14/2012  *RADIOLOGY REPORT*  Clinical Data: Lower abdominal pain.  Nausea and vomiting.  Chronic back pain.  CT ABDOMEN AND PELVIS WITH CONTRAST  Technique:  Multidetector CT imaging of the abdomen and pelvis was performed following the standard protocol during bolus administration of intravenous contrast.  Contrast: OMNIPAQUE IOHEXOL 300 MG/ML  SOLN  Comparison: None.  Findings: Linear subsegmental atelectasis or scarring noted in the left lower lobe.  Inferior margin of a left breast implant noted.  Elevated left hemidiaphragm noted. The liver, spleen, pancreas, and adrenal glands appear unremarkable.  The gallbladder and biliary system appear unremarkable.  Wall thickening in the stomach antrum is likely due to peristalsis.  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  Left kidney appears unremarkable.  Aortoiliac atherosclerotic calcification noted. No pathologic retroperitoneal or porta hepatis adenopathy is identified.  No pathologic pelvic adenopathy is identified.  Urinary bladder appears unremarkable.  Uterus is absent.  Scattered small diverticula of the ascending colon are identified along with scattered  sigmoid colon diverticula, but without compelling findings of acute diverticulitis.  No dilated small bowel is observed.  The appendix is not well seen.  Facet arthropathy is present at L4-5 and L5-S1, with grade 1 degenerative anterolisthesis of L4 on L5.  No free pelvic fluid is observed.  IMPRESSION:  1.  Scattered colonic diverticula, but without compelling findings of acute diverticulitis. 2.  Lower lumbar spondylosis. 3.  Partially duplicated right collecting system. 4.  Atherosclerosis. 5.  Prior hysterectomy. 6.  Elevated left hemidiaphragm  Original Report Authenticated By: Dellia Cloud, M.D.    I personally reviewed the imaging tests through PACS system  I reviewed available ER/hospitalization records thought the EMR    1. Abdominal pain   2. Nausea and vomiting   3. Cervical spinal stenosis       MDM  The patient continued to have nausea and vomiting in the emergency department despite IV fluids pain medicine and antinausea medicine.  CT scan demonstrates no evidence of diverticulitis however with tenderness in her left lower quadrant and obvious diverticulosis throughout her colon I suspect this is early diverticulitis without CT findings at this time.  Cipro and Flagyl given in the emergency department.  The patient will be admitted to the hospitalist service.        Lyanne Co, MD 01/14/12 [phone]

## 2012-01-14 NOTE — Progress Notes (Signed)
Female family member confirmed pcp as DAUB, STEVEN A . Pt asleep

## 2012-01-15 ENCOUNTER — Inpatient Hospital Stay (HOSPITAL_COMMUNITY): Payer: Medicare Other

## 2012-01-15 DIAGNOSIS — M4802 Spinal stenosis, cervical region: Secondary | ICD-10-CM

## 2012-01-15 LAB — CBC
Hemoglobin: 13.4 g/dL (ref 12.0–15.0)
RBC: 4.69 MIL/uL (ref 3.87–5.11)
WBC: 8.5 10*3/uL (ref 4.0–10.5)

## 2012-01-15 LAB — CARDIAC PANEL(CRET KIN+CKTOT+MB+TROPI)
Relative Index: INVALID (ref 0.0–2.5)
Relative Index: INVALID (ref 0.0–2.5)
Troponin I: <0.3 ng/mL (ref <0.30)

## 2012-01-15 LAB — COMPREHENSIVE METABOLIC PANEL
ALT: 10 U/L (ref 0–35)
Alkaline Phosphatase: 96 U/L (ref 39–117)
CO2: 26 mEq/L (ref 19–32)
Chloride: 101 mEq/L (ref 96–112)
GFR calc Af Amer: 80 mL/min — ABNORMAL LOW (ref >90)
GFR calc non Af Amer: 69 mL/min — ABNORMAL LOW (ref >90)
Glucose, Bld: 100 mg/dL — ABNORMAL HIGH (ref 70–99)
Potassium: 3.7 mEq/L (ref 3.5–5.1)
Sodium: 135 mEq/L (ref 135–145)
Total Protein: 5.9 g/dL — ABNORMAL LOW (ref 6.0–8.3)

## 2012-01-15 MED ORDER — HYDRALAZINE HCL 20 MG/ML IJ SOLN
5.0000 mg | Freq: Four times a day (QID) | INTRAMUSCULAR | Status: DC | PRN
  Administered 2012-01-15: 5 mg via INTRAVENOUS
  Filled 2012-01-15: qty 1

## 2012-01-15 NOTE — Progress Notes (Signed)
Nutrition Brief Note  Patient identified on the Nutrition Risk Report for unintended weight loss greater than 10 pounds in the past month and problems chewing/swallowing foods and/or liquids .   Body mass index is 21.80 kg/(m^2). Pt meets criteria for normal weight based on current BMI.   - Pt reports eating when she gets hungry PTA, typically 2 meals/day consisting of junk food r/t living alone and sometimes not cooking and getting burgers/fries or small meals like cereal and sandwiches. Pt states she has access to enough food at home. Pt denies any problems chewing/swallowing foods/liquids, however does state she has problems swallowing pills. Pt denies any significant weight loss recently, thinks she may have lost 1-2 pounds in the past month but nothing big. Pt reports doing well with clear liquid diet. Hopefully diet can be advanced soon.  No nutrition intervention indicated at this time. Will monitor.   Dietitian# [phone]

## 2012-01-15 NOTE — Telephone Encounter (Signed)
I called and left message for her to call me back.

## 2012-01-15 NOTE — Consult Note (Signed)
[facility] CON[facility] Reason for consult:Abdominal Pain Referring Physician: Hospitalist  Sheri Shepherd is an 78 y.o. female.  HPI: 78 year old woman well known to me. She has a history of colon polyps. She had colonoscopy in 2009 which was done for followup of previous colon polyps. At that time, she had a large 1/2 cm pedunculated polyp removed from the descending colon with pathology returned showing a villous adenoma with high-grade dysplasia. She had a one year repeat procedure scheduled, but was unable to go through the procedure despite multiple request to schedule it due to multiple other issues. She has had issues with her neck and has been on chronic pain medications following issues with cervical fusion and has been seen in neurosurgical. She's had vague neck pain much worse for the past 2-3 weeks radiating down her left arm requiring increasing narcotics for pain control. She developed some constipation and had severe left lower quadrant pain. Apparently, she was quite complaining pain and had some vomiting. She came to the emergency room and had a hard bowel movement in the emergency room and a CT scan showed diverticulosis without clinical diverticulitis. The patient has been given 2 doses of Cipro and Flagyl. She notes now that she clinically feels approximately 80% better.  Past Medical History  Diagnosis Date  . Hypertension   . Hypothyroid   . Back pain   . Cancer     Breast   . PONV (postoperative nausea and vomiting)   . History of colonic polyps   . Hx of thyroid nodule     Past Surgical History  Procedure Date  . Cervical fusion     x2  . Mastectomy 1999    Left  . Thyroidectomy, partial   . Abdominal hysterectomy 1978  . Colonoscopy   . Cataract extraction, bilateral   . Tonsillectomy     History reviewed. No pertinent family history.  Social History:  reports that she has never smoked. She has never used smokeless tobacco. She reports that she  does not drink alcohol or use illicit drugs.  Allergies:  Allergies  Allergen Reactions  . Lyrica (Pregabalin)   . Penicillins Other (See Comments)  . Prednisone     Elevated heartrate.  . Sulfa Antibiotics Rash    Red, swollen areas as well.Current Outpatient Prescriptions on File Prior to Visit: levothyroxine (SYNTHROID, LEVOTHROID) 75 MCG tablet, Take 1 tablet (75 mcg total) by mouth daily. Needs office visit with labs., Disp: 30 tablet, Rfl: 3 Red swollen areas as well.      Medications;    . sodium chloride   Intravenous STAT  . aspirin EC  325 mg Oral Daily  . ciprofloxacin  400 mg Intravenous Q12H  . ciprofloxacin  500 mg Oral Once  . enoxaparin (LOVENOX) injection  40 mg Subcutaneous Q24H  . levothyroxine  75 mcg Oral QAC breakfast  . methocarbamol  500 mg Oral TID  . metronidazole  500 mg Intravenous Q8H  . metroNIDAZOLE  500 mg Oral Once  . morphine      .  morphine injection  4 mg Intravenous Once  . ondansetron (ZOFRAN) IV  4 mg Intravenous Once  . ramipril  5 mg Oral Daily  . senna  1 tablet Oral BID  . DISCONTD: aspirin  325 mg Oral Daily   PRN Meds acetaminophen, acetaminophen, bisacodyl, morphine injection, ondansetron (ZOFRAN) IV, ondansetron, DISCONTD:  morphine injection, DISCONTD: ondansetron (ZOFRAN) IV Results for orders placed during the hospital encounter of 01/14/12 (from  the past 48 hour(s))  CBC     Status: Abnormal   Collection Time   01/14/12  8:01 AM      Component Value Range Comment   WBC 10.1  4.0 - 10.5 K/uL    RBC 5.22 (*) 3.87 - 5.11 MIL/uL    Hemoglobin 15.1 (*) 12.0 - 15.0 g/dL    HCT 04.5  40.9 - 81.1 %    MCV 85.2  78.0 - 100.0 fL    MCH 28.9  26.0 - 34.0 pg    MCHC 33.9  30.0 - 36.0 g/dL    RDW 91.4  78.2 - 95.6 %    Platelets 213  150 - 400 K/uL   BASIC METABOLIC PANEL     Status: Abnormal   Collection Time   01/14/12  8:01 AM      Component Value Range Comment   Sodium 135  135 - 145 mEq/L    Potassium 3.6  3.5 - 5.1  mEq/L    Chloride 97  96 - 112 mEq/L    CO2 28  19 - 32 mEq/L    Glucose, Bld 144 (*) 70 - 99 mg/dL    BUN 11  6 - 23 mg/dL    Creatinine, Ser 2.13  0.50 - 1.10 mg/dL    Calcium 9.4  8.4 - 08.6 mg/dL    GFR calc non Af Amer 69 (*) >90 mL/min    GFR calc Af Amer 80 (*) >90 mL/min   LIPASE, BLOOD     Status: Normal   Collection Time   01/14/12  8:01 AM      Component Value Range Comment   Lipase 20  11 - 59 U/L   URINALYSIS, ROUTINE W REFLEX MICROSCOPIC     Status: Normal   Collection Time   01/14/12  8:04 AM      Component Value Range Comment   Color, Urine YELLOW  YELLOW    APPearance CLEAR  CLEAR    Specific Gravity, Urine 1.017  1.005 - 1.030    pH 6.0  5.0 - 8.0    Glucose, UA NEGATIVE  NEGATIVE mg/dL    Hgb urine dipstick NEGATIVE  NEGATIVE    Bilirubin Urine NEGATIVE  NEGATIVE    Ketones, ur NEGATIVE  NEGATIVE mg/dL    Protein, ur NEGATIVE  NEGATIVE mg/dL    Urobilinogen, UA 1.0  0.0 - 1.0 mg/dL    Nitrite NEGATIVE  NEGATIVE    Leukocytes, UA NEGATIVE  NEGATIVE MICROSCOPIC NOT DONE ON URINES WITH NEGATIVE PROTEIN, BLOOD, LEUKOCYTES, NITRITE, OR GLUCOSE <1000 mg/dL.  CARDIAC PANEL(CRET KIN+CKTOT+MB+TROPI)     Status: Normal   Collection Time   01/14/12  8:35 PM      Component Value Range Comment   Total CK 41  7 - 177 U/L    CK, MB 3.3  0.3 - 4.0 ng/mL    Troponin I <0.30  <0.30 ng/mL    Relative Index RELATIVE INDEX IS INVALID  0.0 - 2.5   CARDIAC PANEL(CRET KIN+CKTOT+MB+TROPI)     Status: Normal   Collection Time   01/15/12  3:45 AM      Component Value Range Comment   Total CK 35  7 - 177 U/L    CK, MB 2.7  0.3 - 4.0 ng/mL    Troponin I <0.30  <0.30 ng/mL    Relative Index RELATIVE INDEX IS INVALID  0.0 - 2.5   CBC     Status: Normal  Collection Time   01/15/12  4:15 AM      Component Value Range Comment   WBC 8.5  4.0 - 10.5 K/uL    RBC 4.69  3.87 - 5.11 MIL/uL    Hemoglobin 13.4  12.0 - 15.0 g/dL    HCT 96.2  95.2 - 84.1 %    MCV 84.9  78.0 - 100.0 fL    MCH  28.6  26.0 - 34.0 pg    MCHC 33.7  30.0 - 36.0 g/dL    RDW 32.4  40.1 - 02.7 %    Platelets 188  150 - 400 K/uL   COMPREHENSIVE METABOLIC PANEL     Status: Abnormal   Collection Time   01/15/12  4:15 AM      Component Value Range Comment   Sodium 135  135 - 145 mEq/L    Potassium 3.7  3.5 - 5.1 mEq/L    Chloride 101  96 - 112 mEq/L    CO2 26  19 - 32 mEq/L    Glucose, Bld 100 (*) 70 - 99 mg/dL    BUN 10  6 - 23 mg/dL    Creatinine, Ser 2.53  0.50 - 1.10 mg/dL    Calcium 8.9  8.4 - 66.4 mg/dL    Total Protein 5.9 (*) 6.0 - 8.3 g/dL    Albumin 3.0 (*) 3.5 - 5.2 g/dL    AST 16  0 - 37 U/L    ALT 10  0 - 35 U/L    Alkaline Phosphatase 96  39 - 117 U/L    Total Bilirubin 0.7  0.3 - 1.2 mg/dL    GFR calc non Af Amer 69 (*) >90 mL/min    GFR calc Af Amer 80 (*) >90 mL/min     Mr Cervical Spine Wo Contrast  01/14/2012  *RADIOLOGY REPORT*  Clinical Data: Emergency Room patient with neck and left shoulder pain for 3 weeks.  History of cervical fusion 10 years ago.  Remote history of breast cancer.  MRI CERVICAL SPINE WITHOUT CONTRAST  Technique:  Multiplanar and multiecho pulse sequences of the cervical spine, to include the craniocervical junction and cervicothoracic junction, were obtained according to standard protocol without intravenous contrast.  Comparison: Radiographs 01/03/2012.  Report from the cervical MRI 09/03/1999.  Findings: There is a mildly exaggerated cervical lordosis and thoracic kyphosis status post C5-C6 ACDF.  There is no listhesis or evidence of acute fracture.  There are no paraspinal abnormalities or evidence of metastatic disease.  The cervical cord is normal in signal and caliber.  Mild diffuse intracranial atrophy is noted.  There are bilateral vertebral artery flow voids.  There is asymmetric enlargement of the left thyroid lobe as previously reported.  The patient is status post right thyroidectomy.  There are no significant findings at C1-C2 or C2-C3.  C3-C4:  Disc  height is maintained.  There is asymmetric uncinate spurring and facet hypertrophy on the right contributing to moderate right foraminal stenosis.  There is no cord deformity.  C4-C5:  There is adjacent segment disease with posterior osteophytes covering diffusely bulging disc material.  There is moderate bilateral facet hypertrophy.  The AP diameter of the canal is narrowed to 7 mm.  There is moderate foraminal narrowing bilaterally, worse on the left.  C5-C6:  There is solid interbody and facet joint effusion status post ACDF.  No significant spinal stenosis is seen.  C6-C7:  Disc bulging osteophytes are asymmetric to the right. There is mild bilateral  facet hypertrophy.  The right foramen appears mildly narrowed.  No cord deformity or significant left- sided foraminal compromise is evident.  C7-T1:  Mild bilateral facet hypertrophy.  No significant spinal stenosis or nerve root encroachment.  IMPRESSION:  1.  No acute findings demonstrated status post C5-C6 ACDF.  2.  Adjacent segment disease at C4-C5 contributes to moderate spinal stenosis with effacement of the CSF surrounding the cord. There is no abnormal cord signal.  There is moderate foraminal stenosis bilaterally, worse on the left. 3.  Lesser spondylosis at the additional levels as detailed above.  Original Report Authenticated By: Gerrianne Scale, M.D.   Ct Abdomen Pelvis W Contrast  01/14/2012  *RADIOLOGY REPORT*  Clinical Data: Lower abdominal pain.  Nausea and vomiting.  Chronic back pain.  CT ABDOMEN AND PELVIS WITH CONTRAST  Technique:  Multidetector CT imaging of the abdomen and pelvis was performed following the standard protocol during bolus administration of intravenous contrast.  Contrast: OMNIPAQUE IOHEXOL 300 MG/ML  SOLN  Comparison: None.  Findings: Linear subsegmental atelectasis or scarring noted in the left lower lobe.  Inferior margin of a left breast implant noted.  Elevated left hemidiaphragm noted. The liver, spleen,  pancreas, and adrenal glands appear unremarkable.  The gallbladder and biliary system appear unremarkable.  Wall thickening in the stomach antrum is likely due to peristalsis.  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  Left kidney appears unremarkable.  Aortoiliac atherosclerotic calcification noted. No pathologic retroperitoneal or porta hepatis adenopathy is identified.  No pathologic pelvic adenopathy is identified.  Urinary bladder appears unremarkable.  Uterus is absent.  Scattered small diverticula of the ascending colon are identified along with scattered sigmoid colon diverticula, but without compelling findings of acute diverticulitis.  No dilated small bowel is observed.  The appendix is not well seen.  Facet arthropathy is present at L4-5 and L5-S1, with grade 1 degenerative anterolisthesis of L4 on L5.  No free pelvic fluid is observed.  IMPRESSION:  1.  Scattered colonic diverticula, but without compelling findings of acute diverticulitis. 2.  Lower lumbar spondylosis. 3.  Partially duplicated right collecting system. 4.  Atherosclerosis. 5.  Prior hysterectomy. 6.  Elevated left hemidiaphragm  Original Report Authenticated By: Dellia Cloud, M.D.   Acute Abdominal Series  01/15/2012  *RADIOLOGY REPORT*  Clinical Data: Abdominal pain.  ACUTE ABDOMEN SERIES (ABDOMEN 2 VIEW & CHEST 1 VIEW)  Comparison: CT 01/14/2012  Findings: Elevation of the left hemidiaphragm with left basilar scarring.  Changes of left mastectomy.  Hyperinflation of the right lung, likely COPD.  Mild cardiomegaly.  Oral contrast material is seen within nondistended colon. Scattered diverticula noted.  No evidence of bowel obstruction or free air.  No organomegaly.  No acute bony abnormality.  IMPRESSION: No acute findings.  Suspect COPD.  Scarring in the left lung base.  Original Report Authenticated By: Cyndie Chime, M.D.                Blood  pressure 135/72, pulse 67, temperature 98.3 F (36.8 C), temperature source Oral, resp. rate 16, height 5\' 4"  (1.626 m), weight 57.6 kg (126 lb 15.8 oz), SpO2 97.00%.  Physical exam:   Gen.-patient in radiology suite laying on the table in no acute distress Eyes-sclera nonicteric Lungs-clear Heart-regular rate and rhythm without murmurs or gallops Abdomen-soft nondistended with good bowel sounds. There is minimal if any left lower quadrant tenderness.  Assessment: 1.Left Lower Quadrant Pain. This  could be due to diverticulitis but has not really shown up clinically on CT scan. Patient is are feeling better after a couple doses of antibiotics. She's been on pain medications for her neurosurgical issues and this has been causing her to be constipated. 2. History of colon polyps. Patient does need followup colonoscopy since recent colonoscopy did show colon polyp with high-grade dysplasia. Would probably be best to postpone this until her left lower quadrant pain is been treated.  Plan At this point would continue Cipro and Flagyl and if she continues to improve, we'll discharge her on oral medications and MiraLAX. I can follow her in the office in 2-3 weeks and if she is improved can consider colonoscopy at that time. I have discussed this approach with the patient's daughter.   Kitana Gage JR,Yannis Broce L 01/15/2012, 11:02 AM

## 2012-01-15 NOTE — Progress Notes (Signed)
[facility] PROGRESS NOTE  Sheri Shepherd WUJ:[id number] DOB: December 10, 1932 DOA: 01/14/2012 PCP: Lucilla Edin, MD  Assessment/Plan: Active Problems:  Abdominal pain  -As discussed above-physical exam findings consistent with diverticulitis and she does have colonic diverticula but per CT no compelling findings for diverticulitis  -urinalysis is negative, and lipase within normal limits  -Will continue empiric Cipro Flagyl,pt clinically better will start clears and advance -Appreciate GI input/assistance -Patient also with constipation which might be constipating factor-continue on Senokot and when necessary Dulcolax and follow.  - followup abdominal x-rays neg. Nausea & vomiting  -Likely secondary to above, treatment as above follow. Supportive care.  HTN (hypertension)  -Continue outpatient medications.  Diverticulosis  Neck pain  -As discussed above, pain management, continue muscle relaxants.l consulted Dr. Holley Raring today 8/9 for further recommendations, await eval/input.  Hypothyroidism  -continue synthroid  Code Status: full Family Communication Disposition Plan:to home when medically stable   Brief narrative: The patient is a 78 year old female with past medical history significant for diverticulosis, hypothyroidism, hypertension and history of colonic polyps admitted with abd. Pain associated with n/v. She also reported severe neck pain that she had been having for couple of weeks and an MRI was done in the ED.   Consultants:  GI  Called in consult to Dr Venetia Maxon -awaiting response/eval  Procedures:  none  Antibiotics:  cipro and flagyl  HPI/Subjective: States she feels much better today, no further abd. Pain, no n/v. Neck&L.arm pain better.  Objective: Filed Vitals:   01/14/12 2106 01/15/12 0603 01/15/12 1330 01/15/12 1421  BP: 184/84 135/72 191/52 140/80  Pulse: 71 67 69   Temp: 97.7 F (36.5 C) 98.3 F (36.8 C) 98.4 F (36.9 C)     TempSrc: Oral Oral    Resp: 16 16 18    Height:      Weight:      SpO2: 99% 97% 95%     Intake/Output Summary (Last 24 hours) at 01/15/12 1657 Last data filed at 01/15/12 1633  Gross per 24 hour  Intake 2401.67 ml  Output   3425 ml  Net -1023.33 ml    Exam:   General:  Alert and appropriate, no ditress  Cardiovascular: regular nl S1S2  Respiratory: clear  Abdomen: soft, +BS, NT/ND, no masses palpable  Data Reviewed: Basic Metabolic Panel:  Lab 01/15/12 9562 01/14/12 0801  NA 135 135  K 3.7 3.6  CL 101 97  CO2 26 28  GLUCOSE 100* 144*  BUN 10 11  CREATININE 0.81 0.81  CALCIUM 8.9 9.4  MG -- --  PHOS -- --   Liver Function Tests:  Lab 01/15/12 0415  AST 16  ALT 10  ALKPHOS 96  BILITOT 0.7  PROT 5.9*  ALBUMIN 3.0*    Lab 01/14/12 0801  LIPASE 20  AMYLASE --   No results found for this basename: AMMONIA:5 in the last 168 hours CBC:  Lab 01/15/12 0415 01/14/12 0801  WBC 8.5 10.1  NEUTROABS -- --  HGB 13.4 15.1*  HCT 39.8 44.5  MCV 84.9 85.2  PLT 188 213   Cardiac Enzymes:  Lab 01/15/12 1200 01/15/12 0345 01/14/12 2035  CKTOTAL 56 35 41  CKMB 3.7 2.7 3.3  CKMBINDEX -- -- --  TROPONINI <0.30 <0.30 <0.30   BNP (last 3 results) No results found for this basename: PROBNP:3 in the last 8760 hours CBG: No results found for this basename: GLUCAP:5 in the last 168 hours  No results found for this or any  previous visit (from the past 240 hour(s)).   Studies: Dg Cervical Spine 2-3 Views  01/03/2012  *RADIOLOGY REPORT*  Clinical Data: Neck pain.  History of cervical fusion.  CERVICAL SPINE - 2-3 VIEW  Comparison: None.  Findings: There are anterior plate and screws fusing C5-6.  Mild bony rarefaction at the fusion level but the fusion appears solid. There is advanced facet degenerative changes and mild degenerative disc disease at C6-7 but no acute bony findings or abnormal prevertebral soft tissue swelling.  The lung apices are clear.  IMPRESSION:   1.  C5-6 fusion without complicating features. 2.  Normal alignment and no acute bony findings. 3.  Advanced facet disease.  Clinically significant discrepancy from primary report, if provided: None  Original Report Authenticated By: P. Loralie Champagne, M.D.   Mr Cervical Spine Wo Contrast  01/14/2012  *RADIOLOGY REPORT*  Clinical Data: Emergency Room patient with neck and left shoulder pain for 3 weeks.  History of cervical fusion 10 years ago.  Remote history of breast cancer.  MRI CERVICAL SPINE WITHOUT CONTRAST  Technique:  Multiplanar and multiecho pulse sequences of the cervical spine, to include the craniocervical junction and cervicothoracic junction, were obtained according to standard protocol without intravenous contrast.  Comparison: Radiographs 01/03/2012.  Report from the cervical MRI 09/03/1999.  Findings: There is a mildly exaggerated cervical lordosis and thoracic kyphosis status post C5-C6 ACDF.  There is no listhesis or evidence of acute fracture.  There are no paraspinal abnormalities or evidence of metastatic disease.  The cervical cord is normal in signal and caliber.  Mild diffuse intracranial atrophy is noted.  There are bilateral vertebral artery flow voids.  There is asymmetric enlargement of the left thyroid lobe as previously reported.  The patient is status post right thyroidectomy.  There are no significant findings at C1-C2 or C2-C3.  C3-C4:  Disc height is maintained.  There is asymmetric uncinate spurring and facet hypertrophy on the right contributing to moderate right foraminal stenosis.  There is no cord deformity.  C4-C5:  There is adjacent segment disease with posterior osteophytes covering diffusely bulging disc material.  There is moderate bilateral facet hypertrophy.  The AP diameter of the canal is narrowed to 7 mm.  There is moderate foraminal narrowing bilaterally, worse on the left.  C5-C6:  There is solid interbody and facet joint effusion status post ACDF.  No  significant spinal stenosis is seen.  C6-C7:  Disc bulging osteophytes are asymmetric to the right. There is mild bilateral facet hypertrophy.  The right foramen appears mildly narrowed.  No cord deformity or significant left- sided foraminal compromise is evident.  C7-T1:  Mild bilateral facet hypertrophy.  No significant spinal stenosis or nerve root encroachment.  IMPRESSION:  1.  No acute findings demonstrated status post C5-C6 ACDF.  2.  Adjacent segment disease at C4-C5 contributes to moderate spinal stenosis with effacement of the CSF surrounding the cord. There is no abnormal cord signal.  There is moderate foraminal stenosis bilaterally, worse on the left. 3.  Lesser spondylosis at the additional levels as detailed above.  Original Report Authenticated By: Gerrianne Scale, M.D.   Ct Abdomen Pelvis W Contrast  01/14/2012  *RADIOLOGY REPORT*  Clinical Data: Lower abdominal pain.  Nausea and vomiting.  Chronic back pain.  CT ABDOMEN AND PELVIS WITH CONTRAST  Technique:  Multidetector CT imaging of the abdomen and pelvis was performed following the standard protocol during bolus administration of intravenous contrast.  Contrast: OMNIPAQUE IOHEXOL 300  MG/ML  SOLN  Comparison: None.  Findings: Linear subsegmental atelectasis or scarring noted in the left lower lobe.  Inferior margin of a left breast implant noted.  Elevated left hemidiaphragm noted. The liver, spleen, pancreas, and adrenal glands appear unremarkable.  The gallbladder and biliary system appear unremarkable.  Wall thickening in the stomach antrum is likely due to peristalsis.  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  Left kidney appears unremarkable.  Aortoiliac atherosclerotic calcification noted. No pathologic retroperitoneal or porta hepatis adenopathy is identified.  No pathologic pelvic adenopathy is identified.  Urinary bladder appears unremarkable.  Uterus is absent.   Scattered small diverticula of the ascending colon are identified along with scattered sigmoid colon diverticula, but without compelling findings of acute diverticulitis.  No dilated small bowel is observed.  The appendix is not well seen.  Facet arthropathy is present at L4-5 and L5-S1, with grade 1 degenerative anterolisthesis of L4 on L5.  No free pelvic fluid is observed.  IMPRESSION:  1.  Scattered colonic diverticula, but without compelling findings of acute diverticulitis. 2.  Lower lumbar spondylosis. 3.  Partially duplicated right collecting system. 4.  Atherosclerosis. 5.  Prior hysterectomy. 6.  Elevated left hemidiaphragm  Original Report Authenticated By: Dellia Cloud, M.D.   Acute Abdominal Series  01/15/2012  *RADIOLOGY REPORT*  Clinical Data: Abdominal pain.  ACUTE ABDOMEN SERIES (ABDOMEN 2 VIEW & CHEST 1 VIEW)  Comparison: CT 01/14/2012  Findings: Elevation of the left hemidiaphragm with left basilar scarring.  Changes of left mastectomy.  Hyperinflation of the right lung, likely COPD.  Mild cardiomegaly.  Oral contrast material is seen within nondistended colon. Scattered diverticula noted.  No evidence of bowel obstruction or free air.  No organomegaly.  No acute bony abnormality.  IMPRESSION: No acute findings.  Suspect COPD.  Scarring in the left lung base.  Original Report Authenticated By: Cyndie Chime, M.D.    Scheduled Meds:   . sodium chloride   Intravenous STAT  . aspirin EC  325 mg Oral Daily  . ciprofloxacin  400 mg Intravenous Q12H  . enoxaparin (LOVENOX) injection  40 mg Subcutaneous Q24H  . levothyroxine  75 mcg Oral QAC breakfast  . methocarbamol  500 mg Oral TID  . metronidazole  500 mg Intravenous Q8H  . morphine      . ramipril  5 mg Oral Daily  . senna  1 tablet Oral BID  . DISCONTD: aspirin  325 mg Oral Daily   Continuous Infusions:   . sodium chloride 100 mL/hr at 01/15/12 0457    Active Problems:  Abdominal pain  Nausea & vomiting  HTN  (hypertension)  Diverticulosis  Neck pain    Time spent:    Kela Millin  [facility] Pager [phone]. If 8PM-8AM, please contact night-coverage at [email or web address], password [facility] 01/15/2012, 4:57 PM  LOS: 1 day

## 2012-01-16 MED ORDER — CIPROFLOXACIN HCL 500 MG PO TABS
500.0000 mg | ORAL_TABLET | Freq: Two times a day (BID) | ORAL | Status: DC
  Filled 2012-01-16 (×2): qty 1

## 2012-01-16 MED ORDER — CIPROFLOXACIN HCL 500 MG PO TABS: 500.0000 mg | ORAL_TABLET | Freq: Two times a day (BID) | ORAL | Status: AC

## 2012-01-16 MED ORDER — PROMETHAZINE HCL 25 MG/ML IJ SOLN
12.5000 mg | Freq: Once | INTRAMUSCULAR | Status: AC
  Administered 2012-01-16: 12.5 mg via INTRAVENOUS
  Filled 2012-01-16: qty 1

## 2012-01-16 MED ORDER — POLYETHYLENE GLYCOL 3350 17 GM/SCOOP PO POWD: 17.0000 g | Freq: Every day | ORAL | Status: AC

## 2012-01-16 MED ORDER — METRONIDAZOLE 500 MG PO TABS
500.0000 mg | ORAL_TABLET | Freq: Three times a day (TID) | ORAL | Status: DC
  Administered 2012-01-16: 500 mg via ORAL
  Filled 2012-01-16 (×3): qty 1

## 2012-01-16 MED ORDER — OXYCODONE-ACETAMINOPHEN 5-325 MG PO TABS: 1.0000 | ORAL_TABLET | Freq: Three times a day (TID) | ORAL | Status: DC | PRN

## 2012-01-16 MED ORDER — METRONIDAZOLE 500 MG PO TABS: 500.0000 mg | ORAL_TABLET | Freq: Three times a day (TID) | ORAL | Status: AC

## 2012-01-16 NOTE — Progress Notes (Signed)
Patient and daughter given discharge instructions, and prescription.  Patient and daughter verbalized no questions and were thankful for all the information that was given to them.  Patient will be discharged to home with daughter.  Daughter requested prescription for Percocet be given due to patient being out.  Philomena Doheny RN

## 2012-01-16 NOTE — Telephone Encounter (Signed)
Please call daughter and let her know when she is released from the hospital I will be happy to see her and try and arrange for their help. I will be in the office on Wednesday and would be happy to see her that day .

## 2012-01-16 NOTE — Telephone Encounter (Signed)
Per MD, advise she continue to work with the hospitalists to get neuro eval while she is in the hospital.  Otherwise, after she is discharged she can follow up with Dr. Cleta Alberts this coming Wednesday at 11am for eval and to see what he can do further if neuro hasn't contacted them by then.

## 2012-01-16 NOTE — Progress Notes (Signed)
[facility] Progress Note  Subjective: Patient complaining of arm pain, no more abdominal pain.  Objective: Vital signs in last 24 hours: Temp:  [97.9 F (36.6 C)-99.2 F (37.3 C)] 99.2 F (37.3 C) (08/10 0659) Pulse Rate:  [67-69] 69  (08/10 0659) Resp:  [18-20] 20  (08/10 0659) BP: (140-191)/(52-87) 166/75 mmHg (08/10 0659) SpO2:  [95 %-97 %] 97 % (08/10 0659) Weight change:    PE: Abdomen soft, nontender  Lab Results: Results for orders placed during the hospital encounter of 01/14/12 (from the past 24 hour(s))  CARDIAC PANEL(CRET KIN+CKTOT+MB+TROPI)     Status: Normal   Collection Time   01/15/12 12:00 PM      Component Value Range   Total CK 56  7 - 177 U/L   CK, MB 3.7  0.3 - 4.0 ng/mL   Troponin I <0.30  <0.30 ng/mL   Relative Index RELATIVE INDEX IS INVALID  0.0 - 2.5    Studies/Results: Mr Cervical Spine Wo Contrast  01/14/2012  *RADIOLOGY REPORT*  Clinical Data: Emergency Room patient with neck and left shoulder pain for 3 weeks.  History of cervical fusion 10 years ago.  Remote history of breast cancer.  MRI CERVICAL SPINE WITHOUT CONTRAST  Technique:  Multiplanar and multiecho pulse sequences of the cervical spine, to include the craniocervical junction and cervicothoracic junction, were obtained according to standard protocol without intravenous contrast.  Comparison: Radiographs 01/03/2012.  Report from the cervical MRI 09/03/1999.  Findings: There is a mildly exaggerated cervical lordosis and thoracic kyphosis status post C5-C6 ACDF.  There is no listhesis or evidence of acute fracture.  There are no paraspinal abnormalities or evidence of metastatic disease.  The cervical cord is normal in signal and caliber.  Mild diffuse intracranial atrophy is noted.  There are bilateral vertebral artery flow voids.  There is asymmetric enlargement of the left thyroid lobe as previously reported.  The patient is status post right thyroidectomy.  There are no significant  findings at C1-C2 or C2-C3.  C3-C4:  Disc height is maintained.  There is asymmetric uncinate spurring and facet hypertrophy on the right contributing to moderate right foraminal stenosis.  There is no cord deformity.  C4-C5:  There is adjacent segment disease with posterior osteophytes covering diffusely bulging disc material.  There is moderate bilateral facet hypertrophy.  The AP diameter of the canal is narrowed to 7 mm.  There is moderate foraminal narrowing bilaterally, worse on the left.  C5-C6:  There is solid interbody and facet joint effusion status post ACDF.  No significant spinal stenosis is seen.  C6-C7:  Disc bulging osteophytes are asymmetric to the right. There is mild bilateral facet hypertrophy.  The right foramen appears mildly narrowed.  No cord deformity or significant left- sided foraminal compromise is evident.  C7-T1:  Mild bilateral facet hypertrophy.  No significant spinal stenosis or nerve root encroachment.  IMPRESSION:  1.  No acute findings demonstrated status post C5-C6 ACDF.  2.  Adjacent segment disease at C4-C5 contributes to moderate spinal stenosis with effacement of the CSF surrounding the cord. There is no abnormal cord signal.  There is moderate foraminal stenosis bilaterally, worse on the left. 3.  Lesser spondylosis at the additional levels as detailed above.  Original Report Authenticated By: Gerrianne Scale, M.D.   Acute Abdominal Series  01/15/2012  *RADIOLOGY REPORT*  Clinical Data: Abdominal pain.  ACUTE ABDOMEN SERIES (ABDOMEN 2 VIEW & CHEST 1 VIEW)  Comparison: CT 01/14/2012  Findings: Elevation of  the left hemidiaphragm with left basilar scarring.  Changes of left mastectomy.  Hyperinflation of the right lung, likely COPD.  Mild cardiomegaly.  Oral contrast material is seen within nondistended colon. Scattered diverticula noted.  No evidence of bowel obstruction or free air.  No organomegaly.  No acute bony abnormality.  IMPRESSION: No acute findings.  Suspect  COPD.  Scarring in the left lung base.  Original Report Authenticated By: Cyndie Chime, M.D.      Assessment: Left lower quadrant abdominal pain presumed low-grade diverticulitis  Plan: 1. Continue Cipro and Flagyl orally for total of 10 days 2. Will leave discharge instructions to contact Dr. Randa Evens for followup as she is due for colonoscopy for colon polyps.    Elyce Zollinger C 01/16/2012, 10:45 AM

## 2012-01-16 NOTE — Discharge Summary (Signed)
Physician Discharge Summary  Sheri Shepherd WUX:[id number] DOB: 05-12-1933 DOA: 01/14/2012  PCP: Lucilla Edin, MD  Admit date: 01/14/2012 Discharge date: 01/16/2012  Recommendations for Outpatient Follow-up:  Follow-up Information    Follow up with EDWARDS JR,JAMES L, MD in 3 weeks. (call for appt upon discharge)    Contact information:   [address] [employer], [place]. [place] [address] [phone]       Follow up with Lucilla Edin, MD on 01/20/2012. (per Dr Cleta Alberts- he will be in office WED 8/14 and  pt to come in for follow up.)    Contact information:   [address] [place] [address] [phone]           Discharge Diagnoses:  Active Problems:  Abdominal pain  Nausea & vomiting  HTN (hypertension)  Diverticulosis  Neck pain   Discharge Condition: Improved/stable  Diet recommendation: Low sodium heart healthy  Filed Weights   01/14/12 1700  Weight: 57.6 kg (126 lb 15.8 oz)    The patient is a 78 year old female with past medical history significant for diverticulosis, hypothyroidism, hypertension and history of colonic polyps admitted with abd. Pain associated with n/v. She also reported severe neck pain that she had been having for couple of weeks and an MRI was done in the ED. History of present illness:  Left lower quadrant Abdominal pain/ presumed diverticulitis -As discussed above-physical exam findings consistent with diverticulitis and she does have colonic diverticula but per CT done in the ED on admission no compelling findings for diverticulitis  -urinalysis is negative, and lipase within normal limits  -Patient was started on empiric Cipro Flagyl, and she improved clinically-no further abdominal pain, tolerating by mouth as well and diet advanced to solids which she tolerated prior to discharge today -GI was consulted and Dr. Randa Evens saw patient and agreed with continuing Cipro and  Flagyl for presumed diverticulitis. The patient also has history of colon polyps and need for a colonoscopy since her recent colonoscopy showed a colon polyp with high-grade dysplasia. She is to follow up in the office in 2-3 weeks and if improved colonoscopy to be considered at that time. -Patient also with constipation which might be constipating factor-continue on Senokot and when necessary Dulcolax and follow.  - followup abdominal x-rays neg.  Nausea & vomiting  -Likely secondary to above, treatment as above follow. Supportive care.  HTN (hypertension)  -Continue outpatient medications.  Diverticulosis  Neck pain  -As discussed above, pain management, continue muscle relaxants.she reported her pain was better controlled during this hospital stay. She had no weakness or sensory deficits on exam. Dr. Holley Raring was consulted 8/9 but no call back or eval. done, spoke with Dr. Phoebe Perch on call for Dr. Venetia Maxon today regarding the consult and he recommends for patient to call on Monday for an appointment to be seen in the office. I did communicate this to patient's daughter and she states that she called Dr. Serita Grammes with [facility] and will follow up with him. Hypothyroidism  -continue synthroid History of colon polyps -As above, follow up outpatient with Dr. Gerrit Heck Course by problem list:   Consultants:  GI  Called in consult to Dr Venetia Maxon 8/9, paged Dr Phoebe Perch 8/10 and he stated for pt to make appt to be seen at the office. Procedures:  none Antibiotics:  cipro and flagyl   Discharge Exam: Filed Vitals:   01/16/12 1530  BP: 176/65  Pulse:  85  Temp: 98.2 F (36.8 C)  Resp: 16   Filed Vitals:   01/15/12 2209 01/16/12 0023 01/16/12 0659 01/16/12 1530  BP: 181/86 178/76 166/75 176/65  Pulse:   69 85  Temp:   99.2 F (37.3 C) 98.2 F (36.8 C)  TempSrc:   Oral   Resp:   20 16  Height:      Weight:      SpO2:   97% 98%   Exam:  General: Alert and  appropriate, no ditress  Cardiovascular: regular nl S1S2  Respiratory: clear  Abdomen: soft, +BS, NT/ND, no masses palpable EXT; no edema, no cyanosis  Discharge Instructions  Discharge Orders    Future Orders Please Complete By Expires   Diet - low sodium heart healthy      Increase activity slowly        Medication List  As of 01/16/2012  4:02 PM   TAKE these medications         aspirin 325 MG buffered tablet   Take 325 mg by mouth as needed.      ciprofloxacin 500 MG tablet   Commonly known as: CIPRO   Take 1 tablet (500 mg total) by mouth 2 (two) times daily.      levothyroxine 75 MCG tablet   Commonly known as: SYNTHROID, LEVOTHROID   Take 1 tablet (75 mcg total) by mouth daily. Needs office visit with labs.      methocarbamol 500 MG tablet   Commonly known as: ROBAXIN   Take 1 tablet (500 mg total) by mouth 4 (four) times daily.      metroNIDAZOLE 500 MG tablet   Commonly known as: FLAGYL   Take 1 tablet (500 mg total) by mouth every 8 (eight) hours.      multivitamin with minerals Tabs   Take 1 tablet by mouth daily.      oxyCODONE-acetaminophen 5-325 MG per tablet   Commonly known as: PERCOCET/ROXICET   Take 1 tablet by mouth every 8 (eight) hours as needed for pain.      polyethylene glycol powder powder   Commonly known as: GLYCOLAX/MIRALAX   Take 17 g by mouth daily.      ramipril 5 MG capsule   Commonly known as: ALTACE   Take 1 capsule (5 mg total) by mouth daily.      traMADol 50 MG tablet   Commonly known as: ULTRAM   Take 1 tablet (50 mg total) by mouth 2 (two) times daily.           Follow-up Information    Follow up with EDWARDS JR,JAMES L, MD in 3 weeks. (call for appt upon discharge)    Contact information:   [address] [employer], [place]. [place] [address] [phone]       Follow up with Lucilla Edin, MD on 01/20/2012. (per Dr Cleta Alberts- he will be in office WED 8/14 and  pt to come  in for follow up.)    Contact information:   [address] [place] [address] [phone]           The results of significant diagnostics from this hospitalization (including imaging, microbiology, ancillary and laboratory) are listed below for reference.    Significant Diagnostic Studies: Dg Cervical Spine 2-3 Views  01/03/2012  *RADIOLOGY REPORT*  Clinical Data: Neck pain.  History of cervical fusion.  CERVICAL SPINE - 2-3 VIEW  Comparison: None.  Findings: There are  anterior plate and screws fusing C5-6.  Mild bony rarefaction at the fusion level but the fusion appears solid. There is advanced facet degenerative changes and mild degenerative disc disease at C6-7 but no acute bony findings or abnormal prevertebral soft tissue swelling.  The lung apices are clear.  IMPRESSION:  1.  C5-6 fusion without complicating features. 2.  Normal alignment and no acute bony findings. 3.  Advanced facet disease.  Clinically significant discrepancy from primary report, if provided: None  Original Report Authenticated By: P. Loralie Champagne, M.D.   Mr Cervical Spine Wo Contrast  01/14/2012  *RADIOLOGY REPORT*  Clinical Data: Emergency Room patient with neck and left shoulder pain for 3 weeks.  History of cervical fusion 10 years ago.  Remote history of breast cancer.  MRI CERVICAL SPINE WITHOUT CONTRAST  Technique:  Multiplanar and multiecho pulse sequences of the cervical spine, to include the craniocervical junction and cervicothoracic junction, were obtained according to standard protocol without intravenous contrast.  Comparison: Radiographs 01/03/2012.  Report from the cervical MRI 09/03/1999.  Findings: There is a mildly exaggerated cervical lordosis and thoracic kyphosis status post C5-C6 ACDF.  There is no listhesis or evidence of acute fracture.  There are no paraspinal abnormalities or evidence of metastatic disease.  The cervical cord is normal in signal and caliber.  Mild diffuse  intracranial atrophy is noted.  There are bilateral vertebral artery flow voids.  There is asymmetric enlargement of the left thyroid lobe as previously reported.  The patient is status post right thyroidectomy.  There are no significant findings at C1-C2 or C2-C3.  C3-C4:  Disc height is maintained.  There is asymmetric uncinate spurring and facet hypertrophy on the right contributing to moderate right foraminal stenosis.  There is no cord deformity.  C4-C5:  There is adjacent segment disease with posterior osteophytes covering diffusely bulging disc material.  There is moderate bilateral facet hypertrophy.  The AP diameter of the canal is narrowed to 7 mm.  There is moderate foraminal narrowing bilaterally, worse on the left.  C5-C6:  There is solid interbody and facet joint effusion status post ACDF.  No significant spinal stenosis is seen.  C6-C7:  Disc bulging osteophytes are asymmetric to the right. There is mild bilateral facet hypertrophy.  The right foramen appears mildly narrowed.  No cord deformity or significant left- sided foraminal compromise is evident.  C7-T1:  Mild bilateral facet hypertrophy.  No significant spinal stenosis or nerve root encroachment.  IMPRESSION:  1.  No acute findings demonstrated status post C5-C6 ACDF.  2.  Adjacent segment disease at C4-C5 contributes to moderate spinal stenosis with effacement of the CSF surrounding the cord. There is no abnormal cord signal.  There is moderate foraminal stenosis bilaterally, worse on the left. 3.  Lesser spondylosis at the additional levels as detailed above.  Original Report Authenticated By: Gerrianne Scale, M.D.   Ct Abdomen Pelvis W Contrast  01/14/2012  *RADIOLOGY REPORT*  Clinical Data: Lower abdominal pain.  Nausea and vomiting.  Chronic back pain.  CT ABDOMEN AND PELVIS WITH CONTRAST  Technique:  Multidetector CT imaging of the abdomen and pelvis was performed following the standard protocol during bolus administration of  intravenous contrast.  Contrast: OMNIPAQUE IOHEXOL 300 MG/ML  SOLN  Comparison: None.  Findings: Linear subsegmental atelectasis or scarring noted in the left lower lobe.  Inferior margin of a left breast implant noted.  Elevated left hemidiaphragm noted. The liver, spleen, pancreas, and adrenal glands appear unremarkable.  The gallbladder and biliary system appear unremarkable.  Wall thickening in the stomach antrum is likely due to peristalsis.  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  Left kidney appears unremarkable.  Aortoiliac atherosclerotic calcification noted. No pathologic retroperitoneal or porta hepatis adenopathy is identified.  No pathologic pelvic adenopathy is identified.  Urinary bladder appears unremarkable.  Uterus is absent.  Scattered small diverticula of the ascending colon are identified along with scattered sigmoid colon diverticula, but without compelling findings of acute diverticulitis.  No dilated small bowel is observed.  The appendix is not well seen.  Facet arthropathy is present at L4-5 and L5-S1, with grade 1 degenerative anterolisthesis of L4 on L5.  No free pelvic fluid is observed.  IMPRESSION:  1.  Scattered colonic diverticula, but without compelling findings of acute diverticulitis. 2.  Lower lumbar spondylosis. 3.  Partially duplicated right collecting system. 4.  Atherosclerosis. 5.  Prior hysterectomy. 6.  Elevated left hemidiaphragm  Original Report Authenticated By: Dellia Cloud, M.D.   Acute Abdominal Series  01/15/2012  *RADIOLOGY REPORT*  Clinical Data: Abdominal pain.  ACUTE ABDOMEN SERIES (ABDOMEN 2 VIEW & CHEST 1 VIEW)  Comparison: CT 01/14/2012  Findings: Elevation of the left hemidiaphragm with left basilar scarring.  Changes of left mastectomy.  Hyperinflation of the right lung, likely COPD.  Mild cardiomegaly.  Oral contrast material is seen within nondistended colon. Scattered  diverticula noted.  No evidence of bowel obstruction or free air.  No organomegaly.  No acute bony abnormality.  IMPRESSION: No acute findings.  Suspect COPD.  Scarring in the left lung base.  Original Report Authenticated By: Cyndie Chime, M.D.    Microbiology: No results found for this or any previous visit (from the past 240 hour(s)).   Labs: Basic Metabolic Panel:  Lab 01/15/12 1610 01/14/12 0801  NA 135 135  K 3.7 3.6  CL 101 97  CO2 26 28  GLUCOSE 100* 144*  BUN 10 11  CREATININE 0.81 0.81  CALCIUM 8.9 9.4  MG -- --  PHOS -- --   Liver Function Tests:  Lab 01/15/12 0415  AST 16  ALT 10  ALKPHOS 96  BILITOT 0.7  PROT 5.9*  ALBUMIN 3.0*    Lab 01/14/12 0801  LIPASE 20  AMYLASE --   No results found for this basename: AMMONIA:5 in the last 168 hours CBC:  Lab 01/15/12 0415 01/14/12 0801  WBC 8.5 10.1  NEUTROABS -- --  HGB 13.4 15.1*  HCT 39.8 44.5  MCV 84.9 85.2  PLT 188 213   Cardiac Enzymes:  Lab 01/15/12 1200 01/15/12 0345 01/14/12 2035  CKTOTAL 56 35 41  CKMB 3.7 2.7 3.3  CKMBINDEX -- -- --  TROPONINI <0.30 <0.30 <0.30   BNP: BNP (last 3 results) No results found for this basename: PROBNP:3 in the last 8760 hours CBG: No results found for this basename: GLUCAP:5 in the last 168 hours  Time coordinating discharge: >30 minutes  Signed:  Kela Millin  [facility] 01/16/2012, 4:02 PM

## 2012-01-16 NOTE — Telephone Encounter (Signed)
LMOM to CB. 

## 2012-01-16 NOTE — Telephone Encounter (Signed)
Daughter notified and states she was able to speak with Dr. Channing Mutters in [place], [place] and he plans to review her MRI and call her back to advise further.    She will plan on touching base with Dr. Cleta Alberts on Wednesday.

## 2012-01-16 NOTE — Telephone Encounter (Signed)
Patients daughter called--worried and wanting to talk to Dr. Cleta Alberts in regards to mother.  Patient may be getting released today from [facility] if she can tolerate solid foods.  She is still in pain from both her diverticulitis and neck pain.  Daughter states that both herself and the hospital MDs have tried to contact Neuro for consult and neither have heard back.  Would like MDs opinion about how to get eval from Neuro.  Reviewed MRI, doesn't look emergent, correct?  Please advise.

## 2012-01-16 NOTE — Progress Notes (Signed)
Patient meets P and T Committee Criteria for auto conversion of IV antibiotic to PO: 1. Tolerating diet and other po meds.  2. No malabsorption syndrome. 3. Afebrile 4. MD notes indicate improvement.  Will change antibiotic to po per P and T Committee policy.    Gwen Her PharmD  [phone] 01/16/2012 11:45 AM

## 2012-01-17 ENCOUNTER — Other Ambulatory Visit: Payer: Self-pay

## 2012-01-25 ENCOUNTER — Telehealth: Payer: Self-pay | Admitting: Radiology

## 2012-01-25 NOTE — Telephone Encounter (Signed)
Patient has seen Dr Charlett Blake and Dr Venetia Maxon and she is going to need neck surgery. She had MRI scan done, and Dr Venetia Maxon apologized for the delay with their office. Patients daughter is requesting renewal of the Percocet. Please advise.

## 2012-01-26 ENCOUNTER — Other Ambulatory Visit: Payer: Self-pay | Admitting: Neurosurgery

## 2012-01-26 MED ORDER — OXYCODONE-ACETAMINOPHEN 5-325 MG PO TABS: 1.0000 | ORAL_TABLET | Freq: Three times a day (TID) | ORAL | Status: AC | PRN

## 2012-01-26 NOTE — Telephone Encounter (Signed)
His note should have said he is at 104 and can sign, instead said mediastinum. Have gotten him to sign the Rx and it is at front desk for [name] to pickup, I have called [name] to let her know.

## 2012-01-26 NOTE — Telephone Encounter (Signed)
It is okay to refill her medication. I am down to 104 in the mediastinum the prescription to

## 2012-01-26 NOTE — Telephone Encounter (Signed)
I have printed it, I will call patient after Dr Cleta Alberts signs this he is next door

## 2012-02-11 ENCOUNTER — Encounter (HOSPITAL_COMMUNITY): Payer: Self-pay | Admitting: Pharmacy Technician

## 2012-02-11 NOTE — Pre-Procedure Instructions (Signed)
[address]   02/11/2012   Your procedure is scheduled on: September 12th, Thursday   Report to [facility] at  5:30  AM.  Call this number if you have problems the morning of surgery: [phone]   Remember:   Do not eat food or drink any liquids:After Midnight Wednesday.    Take these medicines the morning of surgery with A SIP OF WATER:  Synthroid    Do not wear jewelry, make-up or nail polish.  Do not wear lotions, powders, or perfumes. You may NOT wear deodorant.  Ladies--Do not shave 48 hours prior to surgery.   Do not bring valuables to the hospital.  Contacts, dentures or bridgework may not be worn into surgery.   Leave suitcase in the car. After surgery it may be brought to your room.  For patients admitted to the hospital, checkout time is 11:00 AM the day of discharge.   Patients discharged the day of surgery will not be allowed to drive home.  Name and phone number of your driver:     Special Instructions: CHG Shower Use Special Wash: 1/2 bottle night before surgery and 1/2 bottle morning of surgery.   Please read over the following fact sheets that you were given: Pain Booklet, MRSA Information and Surgical Site Infection Prevention

## 2012-02-12 ENCOUNTER — Encounter (HOSPITAL_COMMUNITY)
Admission: RE | Admit: 2012-02-12 | Discharge: 2012-02-12 | Payer: Medicare Other | Source: Ambulatory Visit | Attending: Neurosurgery | Admitting: Neurosurgery

## 2012-02-17 ENCOUNTER — Ambulatory Visit (HOSPITAL_COMMUNITY)
Admission: RE | Admit: 2012-02-17 | Discharge: 2012-02-17 | Disposition: A | Discharge status: Home / Self Care | Payer: Medicare Other | Source: Ambulatory Visit | Attending: Neurosurgery | Admitting: Neurosurgery

## 2012-02-17 ENCOUNTER — Encounter (HOSPITAL_COMMUNITY)
Admission: RE | Admit: 2012-02-17 | Discharge: 2012-02-17 | Payer: Medicare Other | Source: Ambulatory Visit | Attending: Neurosurgery | Admitting: Neurosurgery

## 2012-02-17 ENCOUNTER — Encounter (HOSPITAL_COMMUNITY): Payer: Self-pay

## 2012-02-17 HISTORY — DX: Bronchitis, not specified as acute or chronic: J40

## 2012-02-17 HISTORY — DX: Dizziness and giddiness: R42

## 2012-02-17 HISTORY — DX: Anxiety disorder, unspecified: F41.9

## 2012-02-17 HISTORY — DX: Other amnesia: R41.3

## 2012-02-17 HISTORY — DX: Diverticulosis of intestine, part unspecified, without perforation or abscess without bleeding: K57.90

## 2012-02-17 HISTORY — DX: Other skin changes: R23.8

## 2012-02-17 HISTORY — DX: Unspecified hearing loss, unspecified ear: H91.90

## 2012-02-17 HISTORY — DX: Frequency of micturition: R35.0

## 2012-02-17 HISTORY — DX: Spontaneous ecchymoses: R23.3

## 2012-02-17 LAB — COMPREHENSIVE METABOLIC PANEL
AST: 19 U/L (ref 0–37)
Albumin: 4 g/dL (ref 3.5–5.2)
Chloride: 101 mEq/L (ref 96–112)
Creatinine, Ser: 0.75 mg/dL (ref 0.50–1.10)
Total Bilirubin: 0.5 mg/dL (ref 0.3–1.2)
Total Protein: 7.2 g/dL (ref 6.0–8.3)

## 2012-02-17 LAB — CBC
MCHC: 33.3 g/dL (ref 30.0–36.0)
MCV: 86.4 fL (ref 78.0–100.0)
Platelets: 184 10*3/uL (ref 150–400)
RDW: 14.8 % (ref 11.5–15.5)
WBC: 5.4 10*3/uL (ref 4.0–10.5)

## 2012-02-17 LAB — ABO/RH: ABO/RH(D): A POS

## 2012-02-17 LAB — SURGICAL PCR SCREEN
MRSA, PCR: POSITIVE — AB
Staphylococcus aureus: POSITIVE — AB

## 2012-02-17 LAB — TYPE AND SCREEN: ABO/RH(D): A POS

## 2012-02-17 MED ORDER — VANCOMYCIN HCL IN DEXTROSE 1-5 GM/200ML-% IV SOLN
1000.0000 mg | INTRAVENOUS | Status: AC
  Administered 2012-02-18: 1000 mg via INTRAVENOUS
  Filled 2012-02-17: qty 200

## 2012-02-17 NOTE — H&P (Signed)
NEUROSURGICAL CONSULTATION   Sheri Shepherd. Doe   DOB: 04/06/1933 #[id number]    January 21, 2012   HISTORY:     Sheri Shepherd comes in today.  She is 78 years old and currently is complaining of neck pain. Her daughter, Sheri Shepherd, called me and spoke with me after speaking with Dr. Channing Mutters, and I worked her in for neck problems.  She is having neck pain and pain into her left trapezius and deltoid in her left upper arm.  She has been taking Tramadol 50 mg. two times daily, Robaxin 500 mg. four times daily, and Oxycodone 5/325 two to three times daily.  She was recently hospitalized for diverticulitis.    She had an MRI of her cervical spine and has had a previous fusion at C5-6 and C6-7 levels. She has a slight amount of movement of C6-7 on flexion and extension views of the cervical spine. She has a hyperlordotic cervical spine and exaggerated upper thoracic kyphosis.  She has hypertrophied posterior ligamentous tissue at C4-5 with a 7.1 mm. spinal canal.  She has some mild ligamentous hypertrophy at C6-7 and C7-T1 levels.    REVIEW OF SYSTEMS:   A detailed Review of Systems sheet was reviewed with the patient.  Pertinent positives include under eyes - she has had previous eye infections, under ears, nose, mouth, and throat - she notes hearing loss and wearing a hearing aid, under cardiovascular - she notes high blood pressure, under gastrointestinal - she notes nausea and abdominal pain due to her diverticulitis, under musculoskeletal - she notes arm pain and neck pain, under neurologic - she notes problems with her memory, under endocrine - she notes thyroid disease.  All other systems are negative; this includes Constitutional symptoms, Respiratory, Genitourinary, Integumentary & Breast, Psychiatric, Hematologic/Lymphatic, Allergic/Immunologic.    PAST MEDICAL HISTORY:      Current Medical Conditions:    Significant for breast cancer for which she has been treated.  She also has diverticulitis and has a  history of hypertension.      Prior Operations and Hospitalizations:   She had a left mastectomy in 2001.  She has also had a thyroid tumor for which she had surgery in 1974 and I performed surgery on her cervical spine at C5-6 on 02/06/2000 for a left C6 radiculopathy. She also had surgery at C6-7 in 1996 (this was for a radiculopathy on the right side).      Medications and Allergies:  Current medications include Synthroid .75 mg. daily, Ramipril 5 mg. daily for high blood pressure, Ciprofloxacin 500 mg. twice daily for diverticulitis, Metronidazole 500 mg. three times daily for diverticulitis, Tramadol 50 mg. two times daily for neck and arm pain, Methocarbamol 500 mg. four times daily for neck and arm pain, and Oxycodone 5/325 three times daily for her neck.      Height and Weight:     She is 5'2" tall, 128 lbs.   SOCIAL HISTORY:    She denies tobacco, alcohol, or drug use.    PHYSICAL EXAMINATION:      General Appearance:   On examination today, Sheri Shepherd is a pleasant and cooperative woman in no acute distress.      Blood Pressure, Pulse:     Blood pressure is 132/82.  Heart rate is 78 and regular.  Respiratory rate is 18.      HEENT - normocephalic, atraumatic.  The pupils are equal, round and reactive to light.  The extraocular muscles are intact.  Sclerae -  white.  Conjunctiva - pink.  Oropharynx benign.  Uvula midline.     Neck - there are no masses, meningismus, deformities, tracheal deviation, jugular vein distention or carotid bruits.  There is normal cervical range of motion.  Spurlings' test is negative without reproducible radicular pain turning the patient's head to either side.  Lhermitte's sign is not present with axial compression.  Minimal pain in her neck to palpation.  Well healed anterior cervical incision.      Respiratory - there is normal respiratory effort with good intercostal function.  Lungs are clear to auscultation.  There are no rales, rhonchi or wheezes.       Cardiovascular - the heart has regular rate and rhythm to auscultation.  No murmurs are appreciated.  There is no extremity edema, cyanosis or clubbing.  There are palpable pedal pulses.      Abdomen - soft, nontender, no hepatosplenomegaly appreciated or masses.  There are active bowel sounds.  No guarding or rebound.      Musculoskeletal Examination - she has pain in her left shoulder to palpation, decreased range of motion and decreased left deltoid strength, but seems to hurt a lot to her to elevate her left shoulder.      NEUROLOGICAL EXAMINATION: The patient is oriented to time, person and place and has good recall of both recent and remote memory with normal attention span and concentration.  The patient speaks with clear and fluent speech and exhibits normal language function and appropriate fund of knowledge.      Cranial Nerve Examination - pupils are equal, round and reactive to light.  Extraocular movements are full.  Visual fields are full to confrontational testing.  Facial sensation and facial movement are symmetric and intact.  Hearing is intact to finger rub.  Palate is upgoing.  Shoulder shrug is symmetric.  Tongue protrudes in the midline.      Motor Examination - motor strength is 5/5 in the bilateral deltoids, biceps, triceps, handgrips, wrist extensors, interosseous.  In the lower extremities motor strength is 5/5 in hip flexion, extension, quadriceps, hamstrings, plantar flexion, dorsiflexion and extensor hallucis longus.      Sensory Examination - normal to light touch and pinprick sensation in the upper and lower extremities.     Deep Tendon Reflexes - 2 in the biceps, triceps, and brachioradialis, 2 in the knees, 2 in the ankles.  The great toes are downgoing to plantar stimulation.       Cerebellar Examination - normal coordination in upper and lower extremities and normal rapid alternating movements.  Romberg test is negative.    IMPRESSION AND  RECOMMENDATIONS: Sheri Shepherd is a 78 year old woman who has left arm pain and neck pain. While she does have cervical stenosis at C4-5 with a canal diameter of 7.1 mm. and this is concerning, I do believe that her biggest problem relates to her shoulder.  I would like her to have this more thoroughly evaluated and I will send her to Dr. Lunette Shepherd to assess her shoulder.  If Dr. Charlett Shepherd feels that it is more related to her neck, then I think she should undergo posterior cervical decompression surgery with fusion, but I think that the more likely problem relates to her shoulder.    [facility]    Sheri Shepherd. Venetia Maxon, M.D.  JDS:aft cc: Dr. Lesle Chris

## 2012-02-17 NOTE — Progress Notes (Signed)
Nurse called Revonda Standard, PA inquiring about patient needing a type and screen. Type and screen ordered per Revonda Standard being that the surgery will include the thoracic one area.

## 2012-02-18 ENCOUNTER — Encounter (HOSPITAL_COMMUNITY): Payer: Self-pay | Admitting: *Deleted

## 2012-02-18 ENCOUNTER — Inpatient Hospital Stay (HOSPITAL_COMMUNITY): Payer: Medicare Other

## 2012-02-18 ENCOUNTER — Encounter (HOSPITAL_COMMUNITY): Payer: Self-pay | Admitting: Anesthesiology

## 2012-02-18 ENCOUNTER — Inpatient Hospital Stay (HOSPITAL_COMMUNITY)
Admission: RE | Admit: 2012-02-18 | Discharge: 2012-02-23 | DRG: 472 | Disposition: A | Discharge status: Skilled Nursing Facility | Payer: Medicare Other | Source: Ambulatory Visit | Attending: Neurosurgery | Admitting: Neurosurgery

## 2012-02-18 ENCOUNTER — Encounter (HOSPITAL_COMMUNITY)
Admission: RE | Disposition: A | Discharge status: Skilled Nursing Facility | Payer: Self-pay | Source: Ambulatory Visit | Attending: Neurosurgery

## 2012-02-18 ENCOUNTER — Inpatient Hospital Stay (HOSPITAL_COMMUNITY): Payer: Medicare Other | Admitting: Anesthesiology

## 2012-02-18 DIAGNOSIS — K5732 Diverticulitis of large intestine without perforation or abscess without bleeding: Secondary | ICD-10-CM | POA: Diagnosis present

## 2012-02-18 DIAGNOSIS — M4712 Other spondylosis with myelopathy, cervical region: Principal | ICD-10-CM | POA: Diagnosis present

## 2012-02-18 DIAGNOSIS — Z853 Personal history of malignant neoplasm of breast: Secondary | ICD-10-CM

## 2012-02-18 DIAGNOSIS — I10 Essential (primary) hypertension: Secondary | ICD-10-CM

## 2012-02-18 DIAGNOSIS — Z981 Arthrodesis status: Secondary | ICD-10-CM

## 2012-02-18 DIAGNOSIS — E039 Hypothyroidism, unspecified: Secondary | ICD-10-CM

## 2012-02-18 DIAGNOSIS — Z01812 Encounter for preprocedural laboratory examination: Secondary | ICD-10-CM

## 2012-02-18 DIAGNOSIS — Z79899 Other long term (current) drug therapy: Secondary | ICD-10-CM

## 2012-02-18 DIAGNOSIS — Z9010 Acquired absence of unspecified breast and nipple: Secondary | ICD-10-CM

## 2012-02-18 DIAGNOSIS — Z7982 Long term (current) use of aspirin: Secondary | ICD-10-CM

## 2012-02-18 HISTORY — PX: POSTERIOR CERVICAL FUSION/FORAMINOTOMY: SHX5038

## 2012-02-18 SURGERY — POSTERIOR CERVICAL FUSION/FORAMINOTOMY LEVEL 4
Anesthesia: General | Site: Neck | Wound class: Clean

## 2012-02-18 MED ORDER — TRAMADOL HCL 50 MG PO TABS
50.0000 mg | ORAL_TABLET | Freq: Two times a day (BID) | ORAL | Status: DC
  Administered 2012-02-18 – 2012-02-23 (×10): 50 mg via ORAL
  Filled 2012-02-18 (×14): qty 1

## 2012-02-18 MED ORDER — 0.9 % SODIUM CHLORIDE (POUR BTL) OPTIME
TOPICAL | Status: DC | PRN
  Administered 2012-02-18: 1000 mL

## 2012-02-18 MED ORDER — FENTANYL CITRATE 0.05 MG/ML IJ SOLN
INTRAMUSCULAR | Status: DC | PRN
  Administered 2012-02-18 (×3): 50 ug via INTRAVENOUS

## 2012-02-18 MED ORDER — ACETAMINOPHEN 650 MG RE SUPP: 650.0000 mg | RECTAL | Status: DC | PRN

## 2012-02-18 MED ORDER — HYDROMORPHONE HCL PF 1 MG/ML IJ SOLN
0.2500 mg | INTRAMUSCULAR | Status: DC | PRN
  Administered 2012-02-18 (×2): 0.25 mg via INTRAVENOUS

## 2012-02-18 MED ORDER — BACITRACIN 50000 UNITS IM SOLR
INTRAMUSCULAR | Status: AC
  Filled 2012-02-18: qty 1

## 2012-02-18 MED ORDER — OXYCODONE-ACETAMINOPHEN 5-325 MG PO TABS: 1.0000 | ORAL_TABLET | ORAL | Status: DC | PRN

## 2012-02-18 MED ORDER — FLEET ENEMA 7-19 GM/118ML RE ENEM: 1.0000 | ENEMA | Freq: Once | RECTAL | Status: AC | PRN

## 2012-02-18 MED ORDER — OXYCODONE HCL 5 MG PO TABS: 5.0000 mg | ORAL_TABLET | Freq: Once | ORAL | Status: DC | PRN

## 2012-02-18 MED ORDER — SODIUM CHLORIDE 0.9 % IV SOLN
INTRAVENOUS | Status: AC
  Filled 2012-02-18: qty 500

## 2012-02-18 MED ORDER — LACTATED RINGERS IV SOLN
INTRAVENOUS | Status: DC | PRN
  Administered 2012-02-18 (×2): via INTRAVENOUS

## 2012-02-18 MED ORDER — EPHEDRINE SULFATE 50 MG/ML IJ SOLN
INTRAMUSCULAR | Status: DC | PRN
  Administered 2012-02-18: 10 mg via INTRAVENOUS

## 2012-02-18 MED ORDER — BISACODYL 10 MG RE SUPP
10.0000 mg | Freq: Every day | RECTAL | Status: DC | PRN
  Administered 2012-02-22: 10 mg via RECTAL
  Filled 2012-02-18: qty 1

## 2012-02-18 MED ORDER — MORPHINE SULFATE 2 MG/ML IJ SOLN
1.0000 mg | INTRAMUSCULAR | Status: DC | PRN
Start: 2012-02-18 — End: 2012-02-23
  Administered 2012-02-18 – 2012-02-19 (×2): 4 mg via INTRAVENOUS
  Filled 2012-02-18 (×2): qty 2

## 2012-02-18 MED ORDER — SODIUM CHLORIDE 0.9 % IJ SOLN: 3.0000 mL | INTRAMUSCULAR | Status: DC | PRN

## 2012-02-18 MED ORDER — SODIUM CHLORIDE 0.9 % IV SOLN: 250.0000 mL | INTRAVENOUS | Status: DC

## 2012-02-18 MED ORDER — ACETAMINOPHEN 10 MG/ML IV SOLN
INTRAVENOUS | Status: DC | PRN
  Administered 2012-02-18: 1000 mg via INTRAVENOUS

## 2012-02-18 MED ORDER — KCL IN DEXTROSE-NACL 20-5-0.45 MEQ/L-%-% IV SOLN
INTRAVENOUS | Status: DC
  Administered 2012-02-18: 20:00:00 via INTRAVENOUS
  Filled 2012-02-18 (×7): qty 1000

## 2012-02-18 MED ORDER — LIDOCAINE-EPINEPHRINE 1 %-1:100000 IJ SOLN
INTRAMUSCULAR | Status: DC | PRN
  Administered 2012-02-18: 20 mL

## 2012-02-18 MED ORDER — HYDROCODONE-ACETAMINOPHEN 5-325 MG PO TABS
1.0000 | ORAL_TABLET | ORAL | Status: DC | PRN
  Administered 2012-02-18 – 2012-02-21 (×6): 1 via ORAL
  Administered 2012-02-23: 2 via ORAL
  Filled 2012-02-18 (×3): qty 1
  Filled 2012-02-18: qty 2
  Filled 2012-02-18 (×4): qty 1

## 2012-02-18 MED ORDER — LIDOCAINE HCL (CARDIAC) 20 MG/ML IV SOLN
INTRAVENOUS | Status: DC | PRN
  Administered 2012-02-18: 80 mg via INTRAVENOUS

## 2012-02-18 MED ORDER — DOCUSATE SODIUM 100 MG PO CAPS
100.0000 mg | ORAL_CAPSULE | Freq: Two times a day (BID) | ORAL | Status: DC
  Administered 2012-02-18 – 2012-02-23 (×10): 100 mg via ORAL
  Filled 2012-02-18 (×9): qty 1

## 2012-02-18 MED ORDER — BACITRACIN ZINC 500 UNIT/GM EX OINT
TOPICAL_OINTMENT | CUTANEOUS | Status: DC | PRN
  Administered 2012-02-18: 1 via TOPICAL

## 2012-02-18 MED ORDER — ACETAMINOPHEN 325 MG PO TABS: 650.0000 mg | ORAL_TABLET | ORAL | Status: DC | PRN

## 2012-02-18 MED ORDER — ROCURONIUM BROMIDE 100 MG/10ML IV SOLN
INTRAVENOUS | Status: DC | PRN
  Administered 2012-02-18: 40 mg via INTRAVENOUS
  Administered 2012-02-18: 10 mg via INTRAVENOUS

## 2012-02-18 MED ORDER — PHENOL 1.4 % MT LIQD: 1.0000 | OROMUCOSAL | Status: DC | PRN

## 2012-02-18 MED ORDER — PROPOFOL 10 MG/ML IV BOLUS
INTRAVENOUS | Status: DC | PRN
  Administered 2012-02-18: 130 mg via INTRAVENOUS

## 2012-02-18 MED ORDER — MENTHOL 3 MG MT LOZG: 1.0000 | LOZENGE | OROMUCOSAL | Status: DC | PRN

## 2012-02-18 MED ORDER — SODIUM CHLORIDE 0.9 % IR SOLN
Status: DC | PRN
  Administered 2012-02-18: 09:00:00

## 2012-02-18 MED ORDER — GLYCOPYRROLATE 0.2 MG/ML IJ SOLN
INTRAMUSCULAR | Status: DC | PRN
  Administered 2012-02-18: 0.4 mg via INTRAVENOUS

## 2012-02-18 MED ORDER — VANCOMYCIN HCL IN DEXTROSE 1-5 GM/200ML-% IV SOLN
1000.0000 mg | Freq: Two times a day (BID) | INTRAVENOUS | Status: AC
  Administered 2012-02-18 – 2012-02-19 (×2): 1000 mg via INTRAVENOUS
  Filled 2012-02-18 (×2): qty 200

## 2012-02-18 MED ORDER — RAMIPRIL 5 MG PO CAPS
5.0000 mg | ORAL_CAPSULE | Freq: Every day | ORAL | Status: DC
  Administered 2012-02-18 – 2012-02-23 (×6): 5 mg via ORAL
  Filled 2012-02-18 (×6): qty 1

## 2012-02-18 MED ORDER — DEXAMETHASONE SODIUM PHOSPHATE 10 MG/ML IJ SOLN
INTRAMUSCULAR | Status: DC | PRN
  Administered 2012-02-18: 8 mg via INTRAVENOUS

## 2012-02-18 MED ORDER — DROPERIDOL 2.5 MG/ML IJ SOLN: 0.6250 mg | INTRAMUSCULAR | Status: DC | PRN

## 2012-02-18 MED ORDER — ONDANSETRON HCL 4 MG/2ML IJ SOLN
4.0000 mg | INTRAMUSCULAR | Status: DC | PRN
  Administered 2012-02-18 – 2012-02-19 (×2): 4 mg via INTRAVENOUS
  Filled 2012-02-18 (×3): qty 2

## 2012-02-18 MED ORDER — MUPIROCIN 2 % EX OINT
TOPICAL_OINTMENT | CUTANEOUS | Status: AC
  Filled 2012-02-18: qty 22

## 2012-02-18 MED ORDER — ZOLPIDEM TARTRATE 5 MG PO TABS: 5.0000 mg | ORAL_TABLET | Freq: Every evening | ORAL | Status: DC | PRN

## 2012-02-18 MED ORDER — MUPIROCIN 2 % EX OINT
TOPICAL_OINTMENT | Freq: Two times a day (BID) | CUTANEOUS | Status: AC
  Administered 2012-02-18 – 2012-02-20 (×4): via NASAL
  Filled 2012-02-18 (×2): qty 22

## 2012-02-18 MED ORDER — THROMBIN 5000 UNITS EX SOLR
CUTANEOUS | Status: DC | PRN
  Administered 2012-02-18 (×2): 5000 [IU] via TOPICAL

## 2012-02-18 MED ORDER — HYDROMORPHONE HCL PF 1 MG/ML IJ SOLN
INTRAMUSCULAR | Status: AC
  Filled 2012-02-18: qty 1

## 2012-02-18 MED ORDER — DEXTROSE 5 % IV SOLN
INTRAVENOUS | Status: DC | PRN
  Administered 2012-02-18 (×2): via INTRAVENOUS

## 2012-02-18 MED ORDER — OXYCODONE-ACETAMINOPHEN 5-325 MG/5ML PO SOLN: 10.0000 mL | ORAL | Status: DC | PRN

## 2012-02-18 MED ORDER — ADULT MULTIVITAMIN W/MINERALS CH
1.0000 | ORAL_TABLET | Freq: Every day | ORAL | Status: DC
Start: 2012-02-19 — End: 2012-02-23
  Administered 2012-02-19 – 2012-02-23 (×5): 1 via ORAL
  Filled 2012-02-18 (×5): qty 1

## 2012-02-18 MED ORDER — NEOSTIGMINE METHYLSULFATE 1 MG/ML IJ SOLN
INTRAMUSCULAR | Status: DC | PRN
  Administered 2012-02-18: 3 mg via INTRAVENOUS

## 2012-02-18 MED ORDER — SENNA 8.6 MG PO TABS
1.0000 | ORAL_TABLET | Freq: Two times a day (BID) | ORAL | Status: DC
  Administered 2012-02-18 – 2012-02-23 (×10): 8.6 mg via ORAL
  Filled 2012-02-18 (×14): qty 1

## 2012-02-18 MED ORDER — LEVOTHYROXINE SODIUM 75 MCG PO TABS
75.0000 ug | ORAL_TABLET | Freq: Every day | ORAL | Status: DC
  Administered 2012-02-19 – 2012-02-23 (×5): 75 ug via ORAL
  Filled 2012-02-18 (×6): qty 1

## 2012-02-18 MED ORDER — BUPIVACAINE HCL (PF) 0.5 % IJ SOLN
INTRAMUSCULAR | Status: DC | PRN
  Administered 2012-02-18: 30 mL

## 2012-02-18 MED ORDER — METHOCARBAMOL 500 MG PO TABS
500.0000 mg | ORAL_TABLET | Freq: Four times a day (QID) | ORAL | Status: DC
  Administered 2012-02-18 – 2012-02-23 (×19): 500 mg via ORAL
  Filled 2012-02-18 (×27): qty 1

## 2012-02-18 MED ORDER — ACETAMINOPHEN 10 MG/ML IV SOLN
INTRAVENOUS | Status: AC
  Filled 2012-02-18: qty 100

## 2012-02-18 MED ORDER — OXYCODONE HCL 5 MG/5ML PO SOLN: 5.0000 mg | Freq: Once | ORAL | Status: DC | PRN

## 2012-02-18 MED ORDER — SODIUM CHLORIDE 0.9 % IJ SOLN
3.0000 mL | Freq: Two times a day (BID) | INTRAMUSCULAR | Status: DC
  Administered 2012-02-18 – 2012-02-23 (×4): 3 mL via INTRAVENOUS

## 2012-02-18 MED ORDER — OXYCODONE-ACETAMINOPHEN 5-325 MG PO TABS
1.0000 | ORAL_TABLET | ORAL | Status: DC | PRN
  Administered 2012-02-18 – 2012-02-21 (×3): 1 via ORAL
  Administered 2012-02-22: 2 via ORAL
  Administered 2012-02-22: 1 via ORAL
  Administered 2012-02-22 – 2012-02-23 (×2): 2 via ORAL
  Filled 2012-02-18 (×4): qty 2
  Filled 2012-02-18 (×4): qty 1

## 2012-02-18 MED ORDER — ONDANSETRON HCL 4 MG/2ML IJ SOLN
INTRAMUSCULAR | Status: DC | PRN
  Administered 2012-02-18: 4 mg via INTRAVENOUS

## 2012-02-18 MED ORDER — HEMOSTATIC AGENTS (NO CHARGE) OPTIME
TOPICAL | Status: DC | PRN
  Administered 2012-02-18: 1 via TOPICAL

## 2012-02-18 MED ORDER — SENNOSIDES-DOCUSATE SODIUM 8.6-50 MG PO TABS
1.0000 | ORAL_TABLET | Freq: Every evening | ORAL | Status: DC | PRN
  Administered 2012-02-20: 1 via ORAL
  Filled 2012-02-18 (×2): qty 1

## 2012-02-18 SURGICAL SUPPLY — 78 items
APL SKNCLS STERI-STRIP NONHPOA (GAUZE/BANDAGES/DRESSINGS) ×1
BAG DECANTER FOR FLEXI CONT (MISCELLANEOUS) ×2 IMPLANT
BENZOIN TINCTURE PRP APPL 2/3 (GAUZE/BANDAGES/DRESSINGS) ×1 IMPLANT
BIT DRILL 10X2.1XFXD (DRILL) ×1 IMPLANT
BIT DRILL NEURO 2X3.1 SFT TUCH (MISCELLANEOUS) IMPLANT
BIT DRL 10X2.1XFXD (DRILL) ×1
BLADE SURG 11 STRL SS (BLADE) IMPLANT
BLADE SURG ROTATE 9660 (MISCELLANEOUS) IMPLANT
BLADE ULTRA TIP 2M (BLADE) IMPLANT
BUR PRECISION FLUTE 5.0 (BURR) IMPLANT
CANISTER SUCTION 2500CC (MISCELLANEOUS) ×2 IMPLANT
CLOTH BEACON ORANGE TIMEOUT ST (SAFETY) ×2 IMPLANT
CONT SPEC 4OZ CLIKSEAL STRL BL (MISCELLANEOUS) ×2 IMPLANT
DRAPE LAPAROTOMY 100X72 PEDS (DRAPES) ×2 IMPLANT
DRAPE MICROSCOPE LEICA (MISCELLANEOUS) IMPLANT
DRAPE POUCH INSTRU U-SHP 10X18 (DRAPES) ×2 IMPLANT
DRESSING TELFA 8X3 (GAUZE/BANDAGES/DRESSINGS) ×2 IMPLANT
DRILL 10MM (DRILL) ×2
DRILL NEURO 2X3.1 SOFT TOUCH (MISCELLANEOUS)
DRSG PAD ABDOMINAL 8X10 ST (GAUZE/BANDAGES/DRESSINGS) IMPLANT
DURAPREP 6ML APPLICATOR 50/CS (WOUND CARE) ×2 IMPLANT
ELECT REM PT RETURN 9FT ADLT (ELECTROSURGICAL) ×2
ELECTRODE REM PT RTRN 9FT ADLT (ELECTROSURGICAL) ×1 IMPLANT
EVACUATOR 1/8 PVC DRAIN (DRAIN) ×1 IMPLANT
GAUZE SPONGE 4X4 16PLY XRAY LF (GAUZE/BANDAGES/DRESSINGS) IMPLANT
GLOVE BIO SURGEON STRL SZ8 (GLOVE) ×2 IMPLANT
GLOVE BIOGEL PI IND STRL 8 (GLOVE) ×5 IMPLANT
GLOVE BIOGEL PI IND STRL 8.5 (GLOVE) ×1 IMPLANT
GLOVE BIOGEL PI INDICATOR 8 (GLOVE) ×5
GLOVE BIOGEL PI INDICATOR 8.5 (GLOVE) ×1
GLOVE ECLIPSE 7.5 STRL STRAW (GLOVE) ×3 IMPLANT
GLOVE EXAM NITRILE LRG STRL (GLOVE) IMPLANT
GLOVE EXAM NITRILE MD LF STRL (GLOVE) IMPLANT
GLOVE EXAM NITRILE XL STR (GLOVE) IMPLANT
GLOVE EXAM NITRILE XS STR PU (GLOVE) IMPLANT
GLOVE INDICATOR 8.5 STRL (GLOVE) ×2 IMPLANT
GOWN BRE IMP SLV AUR LG STRL (GOWN DISPOSABLE) IMPLANT
GOWN BRE IMP SLV AUR XL STRL (GOWN DISPOSABLE) ×2 IMPLANT
GOWN STRL REIN 2XL LVL4 (GOWN DISPOSABLE) ×2 IMPLANT
HEMOSTAT SURGICEL 2X14 (HEMOSTASIS) IMPLANT
KIT BASIN OR (CUSTOM PROCEDURE TRAY) ×2 IMPLANT
KIT INFUSE X SMALL 1.4CC (Orthopedic Implant) ×2 IMPLANT
KIT ROOM TURNOVER OR (KITS) ×2 IMPLANT
MARKER SKIN DUAL TIP RULER LAB (MISCELLANEOUS) ×2 IMPLANT
NDL HYPO 18GX1.5 BLUNT FILL (NEEDLE) IMPLANT
NDL HYPO 25X1 1.5 SAFETY (NEEDLE) ×1 IMPLANT
NDL SPNL 22GX3.5 QUINCKE BK (NEEDLE) ×1 IMPLANT
NEEDLE HYPO 18GX1.5 BLUNT FILL (NEEDLE) IMPLANT
NEEDLE HYPO 25X1 1.5 SAFETY (NEEDLE) ×2 IMPLANT
NEEDLE SPNL 22GX3.5 QUINCKE BK (NEEDLE) ×2 IMPLANT
NS IRRIG 1000ML POUR BTL (IV SOLUTION) ×2 IMPLANT
PACK LAMINECTOMY NEURO (CUSTOM PROCEDURE TRAY) ×2 IMPLANT
PIN MAYFIELD SKULL DISP (PIN) ×2 IMPLANT
ROD 120MM (Rod) ×2 IMPLANT
ROD SPNL 120X3.3XNS LF CVD (Rod) IMPLANT
RUBBERBAND STERILE (MISCELLANEOUS) IMPLANT
SCREW POLYAXIAL 3.5X10MM (Screw) ×8 IMPLANT
SCREW SET (Screw) ×8 IMPLANT
SPONGE GAUZE 4X4 12PLY (GAUZE/BANDAGES/DRESSINGS) ×2 IMPLANT
SPONGE INTESTINAL PEANUT (DISPOSABLE) IMPLANT
SPONGE SURGIFOAM ABS GEL SZ50 (HEMOSTASIS) ×2 IMPLANT
STAPLER SKIN PROX WIDE 3.9 (STAPLE) ×2 IMPLANT
STRIP CLOSURE SKIN 1/2X4 (GAUZE/BANDAGES/DRESSINGS) ×1 IMPLANT
STRIP NEXOSS 5CC (Neuro Prosthesis/Implant) ×1 IMPLANT
SUT ETHILON 3 0 FSL (SUTURE) ×2 IMPLANT
SUT VIC AB 0 CT1 18XCR BRD8 (SUTURE) ×1 IMPLANT
SUT VIC AB 0 CT1 8-18 (SUTURE) ×2
SUT VIC AB 2-0 CP2 18 (SUTURE) ×2 IMPLANT
SUT VIC AB 3-0 SH 8-18 (SUTURE) IMPLANT
SYR 20ML ECCENTRIC (SYRINGE) ×2 IMPLANT
SYR 3ML LL SCALE MARK (SYRINGE) IMPLANT
TAPE CLOTH SURG 4X10 WHT LF (GAUZE/BANDAGES/DRESSINGS) ×1 IMPLANT
TOWEL OR 17X24 6PK STRL BLUE (TOWEL DISPOSABLE) ×2 IMPLANT
TOWEL OR 17X26 10 PK STRL BLUE (TOWEL DISPOSABLE) ×2 IMPLANT
TRAP SPECIMEN MUCOUS 40CC (MISCELLANEOUS) ×1 IMPLANT
TRAY FOLEY CATH 14FRSI W/METER (CATHETERS) IMPLANT
UNDERPAD 30X30 INCONTINENT (UNDERPADS AND DIAPERS) ×2 IMPLANT
WATER STERILE IRR 1000ML POUR (IV SOLUTION) ×2 IMPLANT

## 2012-02-18 NOTE — Anesthesia Procedure Notes (Signed)
Procedure Name: Intubation Date/Time: 02/18/2012 7:55 AM Performed by: Tyrone Nine Pre-anesthesia Checklist: Patient identified, Timeout performed, Emergency Drugs available, Suction available and Patient being monitored Patient Re-evaluated:Patient Re-evaluated prior to inductionOxygen Delivery Method: Circle system utilized Preoxygenation: Pre-oxygenation with 100% oxygen Intubation Type: IV induction Ventilation: Mask ventilation without difficulty Laryngoscope Size: Mac and 3 Grade View: Grade I Tube type: Oral Tube size: 7.5 mm Number of attempts: 1 Airway Equipment and Method: Stylet Placement Confirmation: ETT inserted through vocal cords under direct vision and breath sounds checked- equal and bilateral Secured at: 21 cm Tube secured with: Tape Dental Injury: Teeth and Oropharynx as per pre-operative assessment

## 2012-02-18 NOTE — Anesthesia Preprocedure Evaluation (Addendum)
Anesthesia Evaluation  Patient identified by MRN, date of birth, ID band Patient awake    Reviewed: Allergy & Precautions, H&P , NPO status , Patient's Chart, lab work & pertinent test results  History of Anesthesia Complications (+) PONV  Airway Mallampati: I TM Distance: >3 FB Neck ROM: Full    Dental  (+) Edentulous Upper, Edentulous Lower and Dental Advisory Given   Pulmonary neg pulmonary ROS,  breath sounds clear to auscultation  Pulmonary exam normal       Cardiovascular hypertension, Pt. on medications Rhythm:Regular Rate:Normal     Neuro/Psych Anxiety    GI/Hepatic negative GI ROS, Neg liver ROS,   Endo/Other  Hypothyroidism   Renal/GU negative Renal ROS     Musculoskeletal   Abdominal   Peds  Hematology   Anesthesia Other Findings   Reproductive/Obstetrics                          Anesthesia Physical Anesthesia Plan  ASA: III  Anesthesia Plan: General   Post-op Pain Management:    Induction: Intravenous  Airway Management Planned: Oral ETT  Additional Equipment:   Intra-op Plan:   Post-operative Plan: Extubation in OR  Informed Consent: I have reviewed the patients History and Physical, chart, labs and discussed the procedure including the risks, benefits and alternatives for the proposed anesthesia with the patient or authorized representative who has indicated his/her understanding and acceptance.   Dental advisory given  Plan Discussed with: CRNA, Anesthesiologist and Surgeon  Anesthesia Plan Comments:        Anesthesia Quick Evaluation

## 2012-02-18 NOTE — Progress Notes (Signed)
Pt having multiple episodes of Incontinence. MD on call for Dr. Venetia Maxon made aware. No new orders given. Pt bladder scanned with amount greater than 600 showing. I&O cath performed per standing orders and received 1400cc of Urine. Will continue to monitor pt.

## 2012-02-18 NOTE — Interval H&P Note (Signed)
History and Physical Interval Note:  02/18/2012 6:34 AM  Sheri Shepherd  has presented today for surgery, with the diagnosis of Cervical spondylosis with myelopathy, Cervical stenosis, Cervical radiculopathy  The various methods of treatment have been discussed with the patient and family. After consideration of risks, benefits and other options for treatment, the patient has consented to  Procedure(s) (LRB) with comments: POSTERIOR CERVICAL FUSION/FORAMINOTOMY LEVEL 4 (N/A) - C4 to T1 Posterior cervical decompression/fusion as a surgical intervention .  The patient's history has been reviewed, patient examined, no change in status, stable for surgery.  I have reviewed the patient's chart and labs.  Questions were answered to the patient's satisfaction.     [name] D  Date of Initial H&P: 02/17/2012  History reviewed, patient examined, no change in status, stable for surgery.

## 2012-02-18 NOTE — Preoperative (Signed)
Beta Blockers   Reason not to administer Beta Blockers:Not Applicable 

## 2012-02-18 NOTE — Transfer of Care (Signed)
Immediate Anesthesia Transfer of Care Note  Patient: Sheri Shepherd  Procedure(s) Performed: Procedure(s) (LRB) with comments: POSTERIOR CERVICAL FUSION/FORAMINOTOMY LEVEL 4 (N/A) - Cervical four - seven  Posterior cervical decompression/fusion  Patient Location: PACU  Anesthesia Type: General  Level of Consciousness: awake, alert , oriented and patient cooperative  Airway & Oxygen Therapy: Patient Spontanous Breathing and Patient connected to nasal cannula oxygen  Post-op Assessment: Report given to PACU RN and Post -op Vital signs reviewed and stable  Post vital signs: Reviewed and stable  Complications: No apparent anesthesia complications

## 2012-02-18 NOTE — Anesthesia Postprocedure Evaluation (Signed)
Anesthesia Post Note  Patient: Sheri Shepherd  Procedure(s) Performed: Procedure(s) (LRB): POSTERIOR CERVICAL FUSION/FORAMINOTOMY LEVEL 4 (N/A)  Anesthesia type: general  Patient location: PACU  Post pain: Pain level controlled  Post assessment: Patient's Cardiovascular Status Stable  Last Vitals:  Filed Vitals:   02/18/12 1145  BP:   Pulse: 54  Temp:   Resp: 14    Post vital signs: Reviewed and stable  Level of consciousness: sedated  Complications: No apparent anesthesia complications

## 2012-02-18 NOTE — Op Note (Signed)
02/18/2012  10:41 AM  PATIENT:  Sheri Shepherd  78 y.o. female  PRE-OPERATIVE DIAGNOSIS:  Cervical spondylosis with myelopathy, Cervical stenosis, Cervical radiculopathy C4-T1  POST-OPERATIVE DIAGNOSIS:  Cervical spondylosis with myelopathy, Cervical stenosis, Cervical radiculopathy C4-T1  PROCEDURE:  Procedure(s) (LRB) with comments: POSTERIOR CERVICAL FUSION/FORAMINOTOMY LEVEL 4 (N/A) - Cervical four - Thoracic One  Posterior cervical decompression/fusion  SURGEON:  Surgeon(s) and Role:    * Maeola Harman, MD - Primary    * Clydene Fake, MD - Assisting  PHYSICIAN ASSISTANT:   ASSISTANTS: P[place], RN   ANESTHESIA:   general  EBL:  Total I/O In: 1900 [I.V.:1900] Out: 20 [Blood:20]  BLOOD ADMINISTERED:none  DRAINS: none and (Medium) Hemovact drain(s) in the Epidural with  Suction Open   LOCAL MEDICATIONS USED:  LIDOCAINE   SPECIMEN:  No Specimen  DISPOSITION OF SPECIMEN:  N/A  COUNTS:  YES  TOURNIQUET:  * No tourniquets in log *  DICTATION: DICTATION: Patient is 78 year old woman with neck pain cervical stenosis and myelopathy s/p ACDF C56.  It was elected to take the patient to surgery for posterior decompression and cervical fusion.  PROCEDURE: Patient was brought to the OR and following smooth and uncomplicated induction of GETA, patient was placed in 3 pin fixation and rolled into a prone position on the OR table.  Shoulders were taped to facilitate Xray visualization. Posterior neck was prepped and draped in the usual fashion.  Area of planned incision was infiltrated with local lidocaine.  Incision was made from C4-T1 and carried through the avascular midline plane to expose these lavels and their lateral masses.  There appeared to be solid fusion at the previously operated level.  Lateral mass screws were placed from C4 to C7 bilaterally according to standard landmarks and their positioning was confirmed with fluoroscopy.  The facet joints and laminae were  decorticated and packed with extra small BMP and NexOss bone graft extender.  Screws and rods were locked down in situ.  A total laminectomy of C4 to T1 was performed with decompression of the cervical spinal cord dura.  A medium Hemovac drain was placed. Wound was closed with 0, 2-0, and 3-0 vicryl sutures.  Sterile occlusive dressing was placed.  Patient was taken out of pins and turned back onto the OR table.  Patient was extubated in the operating room and taken to recovery in stable and satisfactory condition.  Counts were correct at the end of the case.  PLAN OF CARE: Admit to inpatient   PATIENT DISPOSITION:  PACU - hemodynamically stable.   Delay start of Pharmacological VTE agent (>24hrs) due to surgical blood loss or risk of bleeding: yes

## 2012-02-19 MED ORDER — MUPIROCIN 2 % EX OINT
1.0000 "application " | TOPICAL_OINTMENT | Freq: Two times a day (BID) | CUTANEOUS | Status: AC
  Administered 2012-02-20 – 2012-02-22 (×5): 1 via NASAL
  Filled 2012-02-19: qty 22

## 2012-02-19 MED ORDER — CHLORHEXIDINE GLUCONATE CLOTH 2 % EX PADS
6.0000 | MEDICATED_PAD | Freq: Every day | CUTANEOUS | Status: DC
  Administered 2012-02-20 – 2012-02-23 (×4): 6 via TOPICAL

## 2012-02-19 MED ORDER — PROMETHAZINE HCL 25 MG/ML IJ SOLN
12.5000 mg | Freq: Four times a day (QID) | INTRAMUSCULAR | Status: DC | PRN
  Filled 2012-02-19: qty 1

## 2012-02-19 NOTE — Progress Notes (Signed)
Subjective: Patient reports "I feel alright, just a little sore in my shoulders"  Objective: Vital signs in last 24 hours: Temp:  [96.8 F (36 C)-97.8 F (36.6 C)] 97.7 F (36.5 C) (09/13 0400) Pulse Rate:  [52-77] 61  (09/13 0435) Resp:  [11-21] 12  (09/13 0435) BP: (118-174)/(56-76) 118/64 mmHg (09/13 0435) SpO2:  [94 %-100 %] 96 % (09/13 0435)  Intake/Output from previous day: 09/12 0701 - 09/13 0700 In: 1900 [I.V.:1900] Out: 2431 [Urine:2326; Drains:85; Blood:20] Intake/Output this shift:    Awake, conversant. Hemovac patent, 85ml from PACU, 20ml overnight). MAEW. Pain well controlled. Drsg dry & intact. Wearing Vista collar.  Lab Results:  [facility] 02/17/12 1115  WBC 5.4  HGB 14.0  HCT 42.0  PLT 184   BMET  Basename 02/17/12 1115  NA 137  K 4.0  CL 101  CO2 27  GLUCOSE 104*  BUN 9  CREATININE 0.75  CALCIUM 10.3    Studies/Results: Dg Chest 2 View  02/17/2012  *RADIOLOGY REPORT*  Clinical Data: Preoperative respiratory examination for cervical fusion.  CHEST - 2 VIEW  Comparison: Acute abdominal series 01/15/2012.  Findings: There is stable chronic elevation of the left hemidiaphragm with mild pleural thickening at the left costophrenic angle.  Left basilar aeration has improved.  There is no confluent airspace opacity or significant pleural effusion.  The heart size and mediastinal contours are stable.  There are postsurgical changes within the left breast and axilla.  Patient is status post lower cervical fusion.  There are mild degenerative changes in the mid thoracic spine.  IMPRESSION: Stable left hemi diaphragm elevation and left pleural parenchymal scarring, likely related to prior radiation therapy.  No acute cardiopulmonary process.   Original Report Authenticated By: Gerrianne Scale, M.D.    Dg Cervical Spine 1 View  02/18/2012  *RADIOLOGY REPORT*  Clinical Data: Posterior cervical fusion from C4-C7.  DG CERVICAL SPINE - 1 VIEW  Comparison: 01/21/2012   Findings: Single intraoperative image was obtained.  Again noted is anterior plate and screw fixation.  There are new posterior screws at C4, C5, C6 and C7.  Surgical retractor is present.  Oral tubes are present.  IMPRESSION: Posterior cervical fusion.   Original Report Authenticated By: Richarda Overlie, M.D.     Assessment/Plan: Improving day1 post-op.  LOS: 1 day  Per Dr. Venetia Maxon, may transfer to [facility]. Mobilize with assistance.   Georgiann Cocker 02/19/2012, 7:42 AM

## 2012-02-19 NOTE — Progress Notes (Addendum)
Clinical Social Work Department CLINICAL SOCIAL WORK PLACEMENT NOTE 02/19/2012  Patient:  Sheri Shepherd, Sheri Shepherd  Account Number:  [id number] Admit date:  02/18/2012  Clinical Social Worker:  Unk Lightning, LCSW  Date/time:  02/19/2012 03:00 PM  Clinical Social Work is seeking post-discharge placement for this patient at the following level of care:   SKILLED NURSING   (*CSW will update this form in Epic as items are completed)   02/19/2012  Patient/family provided with [facility] Department of Clinical Social Work's list of facilities offering this level of care within the geographic area requested by the patient (or if unable, by the patient's family).  02/19/2012  Patient/family informed of their freedom to choose among providers that offer the needed level of care, that participate in Medicare, Medicaid or managed care program needed by the patient, have an available bed and are willing to accept the patient.  02/19/2012  Patient/family informed of [facility]' ownership interest in [facility], as well as of the fact that they are under no obligation to receive care at this facility.  PASARR submitted to EDS on 02/19/2012 PASARR number received from [employer] on 02/19/2012  FL2 transmitted to all facilities in geographic area requested by pt/family on  02/19/2012 FL2 transmitted to all facilities within larger geographic area on   Patient informed that his/her managed care company has contracts with or will negotiate with  certain facilities, including the following:     Patient/family informed of bed offers received:  02/21/2012 [name]) Patient chooses bed at [facility] [name]) Physician recommends and patient chooses bed at  SNF  Patient to be transferred to [name] on 02/23/2012  Patient to be transferred to facility by [facility]  The following physician request were entered in Epic:   Additional Comments:

## 2012-02-19 NOTE — Evaluation (Signed)
Occupational Therapy Evaluation Patient Details Name: Sheri Shepherd MRN: [id number] DOB: [birth year] Today's Date: 02/19/2012 Time: 4098-1191 OT Time Calculation (min): 22 min  OT Assessment / Plan / Recommendation Clinical Impression  78 yo female admitted for PCF that could benefit from skilled OT acutely.     OT Assessment  Patient needs continued OT Services    Follow Up Recommendations  Skilled nursing facility    Barriers to Discharge      Equipment Recommendations  Rolling walker with 5" wheels;3 in 1 bedside comode    Recommendations for Other Services    Frequency  Min 2X/week    Precautions / Restrictions Precautions Precautions: Cervical   Pertinent Vitals/Pain 9 out 10 then says no that's too much 8 out 10 pain C/o pain at back of head and neck    ADL  Toilet Transfer: Simulated;+2 Total assistance Toilet Transfer: Patient Percentage: 40% Toilet Transfer Method: Sit to stand Toilet Transfer Equipment: Raised toilet seat with arms (or 3-in-1 over toilet) Transfers/Ambulation Related to ADLs: Pt assisted with HHA from recliner to w/c. Pt ambulated ~50 ft with total +2 pt 50% with posterior lean ADL Comments: Pt reluctant to participate initially stating "i just had surg yesterday and I will be 79 next month" Pt educated that the doctor ordered US and that we will be working very slowly to start the healing process. Pt reports pain at back of the neck and back of the head.     OT Diagnosis: Generalized weakness;Acute pain  OT Problem List: Decreased strength;Decreased activity tolerance;Impaired balance (sitting and/or standing);Decreased safety awareness;Decreased knowledge of use of DME or AE;Decreased knowledge of precautions;Impaired UE functional use OT Treatment Interventions: Self-care/ADL training;Therapeutic exercise;Neuromuscular education;DME and/or AE instruction;Therapeutic activities;Cognitive remediation/compensation;Patient/family  education;Balance training   OT Goals Acute Rehab OT Goals OT Goal Formulation: With patient Time For Goal Achievement: 03/04/12 Potential to Achieve Goals: Good ADL Goals Pt Will Perform Grooming: with min assist;Sitting at sink;Supported ADL Goal: Grooming - Progress: Goal set today Pt Will Perform Upper Body Bathing: with min assist;Supported;Sitting at sink ADL Goal: Upper Body Bathing - Progress: Goal set today Pt Will Perform Upper Body Dressing: with min assist;Sitting, chair ADL Goal: Upper Body Dressing - Progress: Goal set today Pt Will Transfer to Toilet: with mod assist;Ambulation;Stand pivot transfer;3-in-1 ADL Goal: Toilet Transfer - Progress: Goal set today Miscellaneous OT Goals Miscellaneous OT Goal #1: Pt will complete bed mobility min (A) as precursor to adls OT Goal: Miscellaneous Goal #1 - Progress: Goal set today  Visit Information  Last OT Received On: 02/19/12 Assistance Needed: +2 PT/OT Co-Evaluation/Treatment: Yes    Subjective Data  Subjective: " I need to wake up" - pt aroused on arrival and with questioning states let me wake up . Cognitive challenges pt reports needing to wake up Patient Stated Goal: I want to get back to doing normal   Prior Functioning  Vision/Perception  Home Living Lives With: Alone Type of Home: Mobile home Home Access: Stairs to enter Entrance Stairs-Number of Steps: 4 Entrance Stairs-Rails: Left;Right Home Layout: One level Bathroom Shower/Tub: Tub/shower unit;Curtain (sit down in the tub) Bathroom Toilet: Standard Home Adaptive Equipment: None Prior Function Level of Independence: Independent Able to Take Stairs?: Yes Vocation: Retired Musician: HOH (hearing aides) Dominant Hand: Right      Cognition  Overall Cognitive Status: Appears within functional limits for tasks assessed/performed Arousal/Alertness: Awake/alert Behavior During Session: [facility] for tasks performed Cognition - Other  Comments: pt with poor recall of medication  crushed in apple sauce with RN giving medication. Pt states let me try it without. Pt with difficulty swallowing without apple sauce.    Extremity/Trunk Assessment Right Upper Extremity Assessment RUE ROM/Strength/Tone: Within functional levels RUE Coordination: WFL - gross motor Left Upper Extremity Assessment LUE ROM/Strength/Tone: Within functional levels LUE Coordination: WFL - gross motor Trunk Assessment Trunk Assessment: Normal   Mobility  Shoulder Instructions  Transfers Transfers: Sit to Stand;Stand to Sit Sit to Stand: 1: +2 Total assist;With upper extremity assist;From chair/3-in-1 Sit to Stand: Patient Percentage: 40% Stand to Sit: 1: +2 Total assist;With upper extremity assist;To chair/3-in-1 Stand to Sit: Patient Percentage: 40%       Exercise     Balance     End of Session OT - End of Session Activity Tolerance: Patient tolerated treatment well Patient left: in chair;with call bell/phone within reach;with nursing in room Nurse Communication: Mobility status  GO     Lucile Shutters 02/19/2012, 1:44 PM Pager: [phone]

## 2012-02-19 NOTE — Progress Notes (Signed)
Patient ID: Sheri Shepherd, female   DOB: 07-Sep-1932, 78 y.o.   MRN: [id number]  Alert, talking with daughter and Child psychotherapist. Drain without significant o/p today - pulled. Dressing replaced. Incision without erythema, swelling, or drainage. Plan for SNF placement.  Georgiann Cocker, RN, BSN

## 2012-02-19 NOTE — Progress Notes (Signed)
Clinical Social Work Department BRIEF PSYCHOSOCIAL ASSESSMENT 02/19/2012  Patient:  Sheri Shepherd, CABEZAS     Account Number:  [id number]     Admit date:  02/18/2012  Clinical Social Worker:  Dennison Bulla  Date/Time:  02/19/2012 03:00 PM  Referred by:  Physician  Date Referred:  02/19/2012 Referred for  SNF Placement   Other Referral:   Interview type:  Patient Other interview type:   Dtr involved    PSYCHOSOCIAL DATA Living Status:  ALONE Admitted from facility:   Level of care:   Primary support name:  [name] Primary support relationship to patient:  CHILD, ADULT Degree of support available:   Strong    CURRENT CONCERNS Current Concerns  Post-Acute Placement   Other Concerns:    SOCIAL WORK ASSESSMENT / PLAN CSW received referral to assist with dc planning. CSW reviewed chart and met with patient at bedside. Dtr present.    CSW introduced myself and explained role. CSW explained PT/OT recommendations for SNF placement. Patient is agreeable for SNF but only for ST. CSW provided SNF list and explained Medicare benefits. CSW received permission to fax out in [place] and [place].    CSW completed FL2, submitted pasarr and faxed out. CSW will follow up with bed offers.   Assessment/plan status:  Psychosocial Support/Ongoing Assessment of Needs Other assessment/ plan:   Information/referral to community resources:   SNF list    PATIENT'S/FAMILY'S RESPONSE TO PLAN OF CARE: Patient and dtr receptive to CSW consult. Patient prefers placement in [place] or [place]. Patient agreeable to dtr being involved in care.

## 2012-02-19 NOTE — Evaluation (Signed)
Physical Therapy Evaluation Patient Details Name: Sheri Shepherd MRN: [id number] DOB: [birth year] Today's Date: 02/19/2012 Time: 2841-3244 PT Time Calculation (min): 33 min  PT Assessment / Plan / Recommendation Clinical Impression  pt adm for elective C4-T1 decompression and fusion.  Moility limited by weakness , decr. balance and somewhat by confusion    PT Assessment  Patient needs continued PT services    Follow Up Recommendations  Skilled nursing facility;Supervision for mobility/OOB    Barriers to Discharge        Equipment Recommendations  Rolling walker with 5" wheels;3 in 1 bedside comode    Recommendations for Other Services     Frequency Min 5X/week    Precautions / Restrictions Precautions Precautions: Cervical Restrictions Weight Bearing Restrictions: No   Pertinent Vitals/Pain       Mobility  Bed Mobility Bed Mobility: Not assessed Transfers Transfers: Sit to Stand;Stand to Sit Sit to Stand: 1: +2 Total assist;With upper extremity assist;From chair/3-in-1 Sit to Stand: Patient Percentage: 40% Stand to Sit: 1: +2 Total assist;With upper extremity assist;To chair/3-in-1 Stand to Sit: Patient Percentage: 40% Details for Transfer Assistance: vc's for hand placement, and lifting assist and help coming forward Ambulation/Gait Ambulation/Gait Assistance: 1: +2 Total assist Ambulation/Gait: Patient Percentage: 60% Ambulation Distance (Feet): 50 Feet Assistive device: 2 person hand held assist Ambulation/Gait Assistance Details: staggery gait with tendency toward listing posteriorly Gait Pattern: Step-through pattern;Decreased step length - right;Decreased step length - left;Decreased stride length    Exercises     PT Diagnosis: Generalized weakness;Acute pain  PT Problem List: Decreased strength;Decreased activity tolerance;Decreased balance;Decreased mobility;Decreased knowledge of use of DME;Decreased knowledge of precautions;Cardiopulmonary status  limiting activity;Pain PT Treatment Interventions: DME instruction;Gait training;Functional mobility training;Therapeutic activities;Balance training;Patient/family education   PT Goals Acute Rehab PT Goals PT Goal Formulation: Patient unable to participate in goal setting Time For Goal Achievement: 02/26/12 Potential to Achieve Goals: Good Pt will go Supine/Side to Sit: with min assist PT Goal: Supine/Side to Sit - Progress: Goal set today Pt will go Sit to Stand: with supervision PT Goal: Sit to Stand - Progress: Goal set today Pt will Transfer Bed to Chair/Chair to Bed: with supervision PT Transfer Goal: Bed to Chair/Chair to Bed - Progress: Goal set today Pt will Ambulate: with supervision;>150 feet;with least restrictive assistive device PT Goal: Ambulate - Progress: Goal set today Pt will Go Up / Down Stairs: 3-5 stairs;with min assist;with rail(s) (if to go home directly only) PT Goal: Up/Down Stairs - Progress: Goal set today  Visit Information  Last PT Received On: 02/19/12 Assistance Needed: +2    Subjective Data  Subjective: Ya'll know I just had surgery Patient Stated Goal: Home able to be by myself   Prior Functioning  Home Living Lives With: Alone Type of Home: Mobile home Home Access: Stairs to enter Secretary/administrator of Steps: 4 Entrance Stairs-Rails: Left;Right Home Layout: One level Bathroom Shower/Tub: Forensic scientist: Standard Home Adaptive Equipment: None Prior Function Level of Independence: Independent Able to Take Stairs?: Yes Vocation: Retired Musician: HOH Dominant Hand: Right    Cognition  Overall Cognitive Status: Appears within functional limits for tasks assessed/performed Arousal/Alertness: Awake/alert Behavior During Session: [facility] for tasks performed Cognition - Other Comments: pt with poor recall of medication crushed in apple sauce with RN giving medication. Pt states let me try it  without. Pt with difficulty swallowing without apple sauce.    Extremity/Trunk Assessment Right Upper Extremity Assessment RUE ROM/Strength/Tone: Within functional levels RUE Coordination: WFL -  gross motor Left Upper Extremity Assessment LUE ROM/Strength/Tone: Within functional levels LUE Coordination: WFL - gross motor Right Lower Extremity Assessment RLE ROM/Strength/Tone: WFL for tasks assessed;Deficits RLE ROM/Strength/Tone Deficits: generally weak bil at 4/5 Left Lower Extremity Assessment LLE ROM/Strength/Tone: [facility] for tasks assessed Trunk Assessment Trunk Assessment: Normal   Balance Balance Balance Assessed: Yes Static Standing Balance Static Standing - Balance Support: During functional activity;Bilateral upper extremity supported Static Standing - Level of Assistance: 4: Min assist  End of Session PT - End of Session Activity Tolerance: Patient tolerated treatment well;Patient limited by pain Patient left: Other (comment) (w/c) Nurse Communication: Mobility status  GP     Moesha Sarchet, Eliseo Gum 02/19/2012, 2:55 PM  02/19/2012  Pettus Bing, PT [phone] (pager)

## 2012-02-19 NOTE — Progress Notes (Signed)
Pt had episode of nausea and vomitting  In route to [facility].  Vomitted about 300cc's of brown liquid effluent.  After vomitting, felt better.  Patient now in room to bed no complaints.

## 2012-02-19 NOTE — Progress Notes (Signed)
UR COMPLETED  

## 2012-02-19 NOTE — Progress Notes (Signed)
Transfer to floor, leave drain in place today.

## 2012-02-19 NOTE — Progress Notes (Signed)
Report given to nurse on [facility] [place].  Pt to be transferred to rm 19  Daughter aware of transfer.

## 2012-02-20 NOTE — Progress Notes (Signed)
Patient ID: Sheri Shepherd, female   DOB: 1933/01/13, 78 y.o.   MRN: [id number] Subjective:  The patient is alert and pleasant. She complains of neck pain.  Objective: Vital signs in last 24 hours: Temp:  [97.3 F (36.3 C)-99.4 F (37.4 C)] 99.4 F (37.4 C) (09/14 0523) Pulse Rate:  [64-112] 112  (09/14 0523) Resp:  [12-18] 16  (09/14 0523) BP: (142-206)/(64-89) 180/74 mmHg (09/14 0523) SpO2:  [96 %-100 %] 96 % (09/14 0523) Weight:  [52.7 kg (116 lb 2.9 oz)-57.4 kg (126 lb 8.7 oz)] 52.7 kg (116 lb 2.9 oz) (09/13 1400)  Intake/Output from previous day: 09/13 0701 - 09/14 0700 In: 1871.3 [I.V.:1871.3] Out: 750 [Urine:675; Drains:75] Intake/Output this shift: Total I/O In: 600 [P.O.:600] Out: -   Physical exam the patient is alert and oriented. She is moving all 4 extremities well.  Lab Results:  [facility] 02/17/12 1115  WBC 5.4  HGB 14.0  HCT 42.0  PLT 184   BMET  Basename 02/17/12 1115  NA 137  K 4.0  CL 101  CO2 27  GLUCOSE 104*  BUN 9  CREATININE 0.75  CALCIUM 10.3    Studies/Results: Dg Cervical Spine 1 View  02/18/2012  *RADIOLOGY REPORT*  Clinical Data: Posterior cervical fusion from C4-C7.  DG CERVICAL SPINE - 1 VIEW  Comparison: 01/21/2012  Findings: Single intraoperative image was obtained.  Again noted is anterior plate and screw fixation.  There are new posterior screws at C4, C5, C6 and C7.  Surgical retractor is present.  Oral tubes are present.  IMPRESSION: Posterior cervical fusion.   Original Report Authenticated By: Richarda Overlie, M.D.     Assessment/Plan: Postop day #2: We are awaiting skilled nursing facility placement.  LOS: 2 days     Linzie Criss D 02/20/2012, 9:10 AM

## 2012-02-20 NOTE — Progress Notes (Signed)
2340. Before pt daughter left for the night, she inform RN her mother was going to need something to help her sleep for the night so nurse should give her mother percocet before she sleeps tonight. Pt is alert and oriented x3 through out the shift and around 2330 nurse to percocet to administer to pt and also flush her Saline locked IV but pt refused. She insisted she has already taken the Percocet and that she don't need it. It was explained to pt the previous medication was not percocet but something else and explained each medication but pt was getting irritable and agitated. Pt insisted IV site be pulled out and also requested to talk to manager and charge RN was called to room and informed of the situation. Pt kept on refusing the pain medication as well as removal of IV access. Charge RN inform me Banker) to hold off all pain medication including scheduled and non-scheduled. Also to take out the IV access which is taken out. Bed alarm on and pt in bed quietly. Will continue to monitor quietly.

## 2012-02-20 NOTE — Progress Notes (Signed)
Physical Therapy Treatment Patient Details Name: Sheri Shepherd MRN: [id number] DOB: 1932-10-16 Today's Date: 02/20/2012 Time: 9562-1308 PT Time Calculation (min): 23 min  PT Assessment / Plan / Recommendation Comments on Treatment Session  Pt progressing well, required only 1 assist with +2 for chair follow. Pt still complains of pain during all movement. Continue per plan    Follow Up Recommendations  Skilled nursing facility;Supervision for mobility/OOB    Barriers to Discharge        Equipment Recommendations  Rolling walker with 5" wheels;3 in 1 bedside comode    Recommendations for Other Services    Frequency Min 5X/week   Plan Discharge plan remains appropriate;Frequency remains appropriate    Precautions / Restrictions Precautions Precautions: Cervical Restrictions Weight Bearing Restrictions: No   Pertinent Vitals/Pain Pain with no numerical number given but complaints of pain during all mobility, increased during sit/stand    Mobility  Bed Mobility Bed Mobility: Not assessed Transfers Transfers: Sit to Stand;Stand to Sit Sit to Stand: 3: Mod assist;4: Min assist;With upper extremity assist;From toilet;From chair/3-in-1 Stand to Sit: 3: Mod assist;With upper extremity assist;To chair/3-in-1 Details for Transfer Assistance: VC for safe hand placement to /from RW. Assist for anterior translation and support into standing and for a controlled descent Ambulation/Gait Ambulation/Gait Assistance: 4: Min assist;3: Mod assist Ambulation Distance (Feet): 40 Feet Assistive device: Rolling walker Ambulation/Gait Assistance Details: Min assist with mod assist required at times for stability and safety during ambulation, increased assist during turns. Pt with extremely small steps, able to barely increase step length with cueing.  Gait Pattern: Step-through pattern;Decreased step length - right;Decreased step length - left;Decreased stride length;Decreased hip/knee flexion  - right;Decreased hip/knee flexion - left;Narrow base of support Gait velocity: decreased gait speed    Exercises     PT Diagnosis:    PT Problem List:   PT Treatment Interventions:     PT Goals Acute Rehab PT Goals PT Goal: Sit to Stand - Progress: Progressing toward goal PT Transfer Goal: Bed to Chair/Chair to Bed - Progress: Progressing toward goal PT Goal: Ambulate - Progress: Progressing toward goal  Visit Information  Last PT Received On: 02/20/12 Assistance Needed: +1 (2 for chair follow)    Subjective Data      Cognition  Overall Cognitive Status: Appears within functional limits for tasks assessed/performed Arousal/Alertness: Awake/alert Behavior During Session: [facility] for tasks performed Cognition - Other Comments: Pt with decreased memory. Slow to recall    Balance     End of Session PT - End of Session Equipment Utilized During Treatment: Gait belt;Cervical collar Activity Tolerance: Patient tolerated treatment well Patient left: in chair;with call bell/phone within reach Nurse Communication: Mobility status   GP     Milana Kidney 02/20/2012, 6:08 PM

## 2012-02-21 NOTE — Progress Notes (Signed)
Patient ID: Sheri Shepherd, female   DOB: 1932-12-13, 78 y.o.   MRN: [id number] Pt doing fairly well. Some neck and arm pain. Neuro non-focal. In collar. CCM.

## 2012-02-21 NOTE — Progress Notes (Signed)
CSW spoke with pt's dtr, [name] [phone], at pt's request re: bed offers. Pt and pt's family have chosen [facility]. CSW notified [facility] of pt's choice. Weekday CSW will f/u and assist with d/c when pt stable.  Dellie Burns, MSW, LCSWA [phone] (Weekends 8:00am-4:30pm)

## 2012-02-22 ENCOUNTER — Encounter (HOSPITAL_COMMUNITY): Payer: Self-pay | Admitting: Neurosurgery

## 2012-02-22 NOTE — Plan of Care (Signed)
Problem: Consults Goal: Diagnosis - Spinal Surgery Outcome: Completed/Met Date Met:  02/22/12 Cervical Spine Fusion     

## 2012-02-22 NOTE — Progress Notes (Signed)
Subjective: Patient reports "I'm still sore in my shoulders."  Objective: Vital signs in last 24 hours: Temp:  [97.7 F (36.5 C)-99.6 F (37.6 C)] 97.7 F (36.5 C) (09/16 0300) Pulse Rate:  [72-82] 77  (09/16 0300) Resp:  [17-18] 18  (09/16 0300) BP: (148-189)/(63-84) 148/63 mmHg (09/16 0300) SpO2:  [96 %-100 %] 97 % (09/16 0300)  Intake/Output from previous day:   Intake/Output this shift:    Awakens to voice. reports shoulder/posterior neck pain persisting - reassured. Drsg intact, clean, and dry. MAEW. Oatmeal eaten, eggs & bagel untouched. Daughter calls during visit: unable to take pt to [facility] until late afternoon, concerned about pt's evening confusion coinciding with her arrival at [facility] & her poor po intake over weekend.  Lab Results: No results found for this basename: WBC:2,HGB:2,HCT:2,PLT:2 in the last 72 hours BMET No results found for this basename: NA:2,K:2,CL:2,CO2:2,GLUCOSE:2,BUN:2,CREATININE:2,CALCIUM:2 in the last 72 hours  Studies/Results: No results found.  Assessment/Plan: Improving  LOS: 4 days  Continue to mobilize. Will d/w Dr. Venetia Maxon plans for transfer.   Georgiann Cocker 02/22/2012, 9:04 AM

## 2012-02-22 NOTE — Progress Notes (Signed)
Physical Therapy Treatment Patient Details Name: Sheri Shepherd MRN: [id number] DOB: [birth year] Today's Date: 02/22/2012 Time: 1000-1023 PT Time Calculation (min): 23 min  PT Assessment / Plan / Recommendation Comments on Treatment Session  Patient making good progress today. Continue with increasing mobility and current POC    Follow Up Recommendations  Skilled nursing facility;Supervision for mobility/OOB    Barriers to Discharge        Equipment Recommendations  Rolling walker with 5" wheels;3 in 1 bedside comode    Recommendations for Other Services    Frequency Min 5X/week   Plan Discharge plan remains appropriate;Frequency remains appropriate    Precautions / Restrictions Precautions Precautions: Cervical   Pertinent Vitals/Pain     Mobility  Bed Mobility Bed Mobility: Right Sidelying to Sit;Rolling Right Rolling Right: 3: Mod assist;With rail Right Sidelying to Sit: 3: Mod assist;With rails;HOB flat Details for Bed Mobility Assistance: A for positioning and for sequency.  Transfers Sit to Stand: 4: Min assist;With upper extremity assist;From bed;From chair/3-in-1 Stand to Sit: To chair/3-in-1;With armrests;With upper extremity assist Details for Transfer Assistance: Cues for safe hand placement and not to pull up from RW. Cues for safe sitting.  Ambulation/Gait Ambulation/Gait Assistance: 3: Mod assist Ambulation Distance (Feet): 120 Feet (one seated rest break) Assistive device: Rolling walker Ambulation/Gait Assistance Details: A to faciliation forward motion with ambulation and A with RW management. Patient with very decreased steppage Gait Pattern: Decreased step length - right;Decreased step length - left;Step-to pattern;Trunk flexed    Exercises     PT Diagnosis:    PT Problem List:   PT Treatment Interventions:     PT Goals Acute Rehab PT Goals PT Goal: Supine/Side to Sit - Progress: Progressing toward goal PT Transfer Goal: Bed to Chair/Chair  to Bed - Progress: Progressing toward goal PT Goal: Ambulate - Progress: Progressing toward goal  Visit Information  Last PT Received On: 02/22/12 Assistance Needed: +2 (To follow in chair)    Subjective Data      Cognition  Overall Cognitive Status: Appears within functional limits for tasks assessed/performed Arousal/Alertness: Awake/alert Orientation Level: Appears intact for tasks assessed Behavior During Session: [facility] for tasks performed Cognition - Other Comments: Patient a little slow to recall and moans throughout    Balance     End of Session PT - End of Session Equipment Utilized During Treatment: Gait belt;Cervical collar Activity Tolerance: Patient tolerated treatment well Patient left: in chair;with call bell/phone within reach Nurse Communication: Mobility status   GP     Fredrich Birks 02/22/2012, 12:52 PM 02/22/2012 Fredrich Birks PTA [phone] pager [phone] office

## 2012-02-22 NOTE — Clinical Social Work Note (Signed)
CSW spoke with Genia Harold, admission's coordinator at [facility] and pt's daughter will be completing admissions paperwork today. Bed will be available at [facility] when pt will is ready for discharge. Georgiann Cocker, RN is aware. CSW will continue to follow.   Dede Query, MSW, Theresia Majors [phone]

## 2012-02-22 NOTE — Progress Notes (Signed)
As above.

## 2012-02-23 MED ORDER — METHOCARBAMOL 500 MG PO TABS: 500.0000 mg | ORAL_TABLET | Freq: Four times a day (QID) | ORAL | Status: DC

## 2012-02-23 MED ORDER — HYDROCODONE-ACETAMINOPHEN 5-325 MG PO TABS: 1.0000 | ORAL_TABLET | ORAL | Status: DC | PRN

## 2012-02-23 NOTE — Progress Notes (Signed)
Physical Therapy Treatment Patient Details Name: Sheri Shepherd MRN: [id number] DOB: Dec 25, 1932 Today's Date: 02/23/2012 Time: 1191-4782 PT Time Calculation (min): 27 min  PT Assessment / Plan / Recommendation Comments on Treatment Session  Patient ambulating slowly again this session. Moaning throoughout and requires encouragement    Follow Up Recommendations  Skilled nursing facility;Supervision for mobility/OOB    Barriers to Discharge        Equipment Recommendations  Rolling walker with 5" wheels;3 in 1 bedside comode    Recommendations for Other Services    Frequency Min 5X/week   Plan Discharge plan remains appropriate;Frequency remains appropriate    Precautions / Restrictions Precautions Precautions: Cervical   Pertinent Vitals/Pain     Mobility  Bed Mobility Bed Mobility: Not assessed Transfers Sit to Stand: 4: Min assist;With armrests;From chair/3-in-1 Stand to Sit: 4: Min assist;With armrests;To chair/3-in-1 Details for Transfer Assistance: A for stabilization and to control descent to reclnier. Cues for safe hand placement.  Ambulation/Gait Ambulation/Gait Assistance: 3: Mod assist Ambulation Distance (Feet): 120 Feet Assistive device: Rolling walker Ambulation/Gait Assistance Details: A to facilitate forward movement with ambulation and A with RW management Gait Pattern: Step-through pattern;Decreased stride length;Decreased step length - right;Decreased step length - left;Decreased hip/knee flexion - right;Decreased hip/knee flexion - left General Gait Details: decreased    Exercises     PT Diagnosis:    PT Problem List:   PT Treatment Interventions:     PT Goals Acute Rehab PT Goals PT Goal: Sit to Stand - Progress: Progressing toward goal PT Transfer Goal: Bed to Chair/Chair to Bed - Progress: Progressing toward goal PT Goal: Ambulate - Progress: Progressing toward goal  Visit Information  Last PT Received On: 02/23/12 Assistance Needed:  +1    Subjective Data      Cognition  Overall Cognitive Status: Appears within functional limits for tasks assessed/performed Arousal/Alertness: Awake/alert Orientation Level: Appears intact for tasks assessed Behavior During Session: [facility] for tasks performed    Balance     End of Session PT - End of Session Equipment Utilized During Treatment: Gait belt Activity Tolerance: Patient tolerated treatment well Patient left: in chair;with call bell/phone within reach Nurse Communication: Mobility status   GP     Fredrich Birks 02/23/2012, 2:51 PM 02/23/2012 Fredrich Birks PTA [phone] pager [phone] office

## 2012-02-23 NOTE — Discharge Summary (Signed)
Physician Discharge Summary  Patient ID: Sheri Shepherd MRN: [id number] DOB/AGE: Feb 14, 1933 78 y.o.  Admit date: 02/18/2012 Discharge date: 02/23/2012  Admission Diagnoses: Cervical spondylosis with myelopathy, Cervical stenosis, Cervical radiculopathy C4-T1    Discharge Diagnoses: Cervical spondylosis with myelopathy, Cervical stenosis, Cervical radiculopathy C4-T1 s/p POSTERIOR CERVICAL FUSION/FORAMINOTOMY LEVEL 4 (N/A) - Cervical four - Thoracic One  Posterior cervical decompression/fusion   Active Problems:  * No active hospital problems. *    Discharged Condition: good  Hospital Course: Sheri Shepherd was admitted on 02-18-12 with dx of Cervical spondylosis with myelopathy, Cervical stenosis, Cervical radiculopathy C4-T1. After uncomplicated posterior cervical decompression and fusion, she recovered in Neuro PACU before transfer [facility] for nursing and therapy care. She has progressed slowly as expected, but steadily.    Consults: None  Significant Diagnostic Studies: radiology: X-Ray: intra-operative  Treatments: surgery: POSTERIOR CERVICAL FUSION/FORAMINOTOMY LEVEL 4 (N/A) - Cervical four - Thoracic One  Posterior cervical decompression/fusion   Discharge Exam: Blood pressure 153/65, pulse 68, temperature 98.9 F (37.2 C), temperature source Oral, resp. rate 16, height 5\' 4"  (1.626 m), weight 52.7 kg (116 lb 2.9 oz), SpO2 98.00%. Awakens to voice. Smiling, reporting no pain at present. Drsg intact, dry. MAEW.     Disposition: D/C to SNF ([facility]). Rolling walker & bsc ordered. Rx's to chart for Robaxin, Percocet, & Hydrocodone. Pt/caregiver should call office for f/u appt with Dr. Venetia Maxon in 3-4wks.       Medication List     As of 02/23/2012  7:59 AM    TAKE these medications         aspirin 325 MG tablet   Take 325 mg by mouth daily as needed. For pain      HYDROcodone-acetaminophen 5-325 MG per tablet   Commonly known as:  NORCO/VICODIN   Take 1-2 tablets by mouth every 4 (four) hours as needed.      levothyroxine 75 MCG tablet   Commonly known as: SYNTHROID, LEVOTHROID   Take 1 tablet (75 mcg total) by mouth daily. Needs office visit with labs.      methocarbamol 500 MG tablet   Commonly known as: ROBAXIN   Take 1 tablet (500 mg total) by mouth 4 (four) times daily.      multivitamin with minerals Tabs   Take 1 tablet by mouth daily.      oxyCODONE-acetaminophen 5-325 MG/5ML solution   Commonly known as: ROXICET   Take by mouth every 8 (eight) hours as needed.      oxyCODONE-acetaminophen 5-325 MG per tablet   Commonly known as: PERCOCET/ROXICET   Take 1 tablet by mouth every 8 (eight) hours as needed.      ramipril 5 MG capsule   Commonly known as: ALTACE   Take 1 capsule (5 mg total) by mouth daily.      traMADol 50 MG tablet   Commonly known as: ULTRAM   Take 50 mg by mouth 2 (two) times daily.         Signed: Georgiann Cocker 02/23/2012, 7:59 AM

## 2012-02-23 NOTE — Progress Notes (Signed)
Subjective: Patient reports no discomfort at present, smiling  Objective: Vital signs in last 24 hours: Temp:  [97.7 F (36.5 C)-98.9 F (37.2 C)] 98.9 F (37.2 C) (09/17 0600) Pulse Rate:  [68-83] 68  (09/17 0600) Resp:  [16-18] 16  (09/17 0600) BP: (116-170)/(57-73) 153/65 mmHg (09/17 0600) SpO2:  [96 %-100 %] 98 % (09/17 0600)  Intake/Output from previous day: 09/16 0701 - 09/17 0700 In: -  Out: 1 [Stool:1] Intake/Output this shift:    Awakens to voice. Smiling, reporting no pain at present. Drsg intact, dry. MAEW.   Lab Results: No results found for this basename: WBC:2,HGB:2,HCT:2,PLT:2 in the last 72 hours BMET No results found for this basename: NA:2,K:2,CL:2,CO2:2,GLUCOSE:2,BUN:2,CREATININE:2,CALCIUM:2 in the last 72 hours  Studies/Results: No results found.  Assessment/Plan: Improving  LOS: 5 days  Per Dr. Venetia Maxon, d/c IV, d/c to SNF ([facility]). Rolling walker & bsc ordered. Rx's to chart for Robaxin, Percocet, & Hydrocodone. Pt/caregiver should call office for f/u appt with Dr. Venetia Maxon in 3-4wks.   Georgiann Cocker 02/23/2012, 7:54 AM

## 2012-02-23 NOTE — Progress Notes (Signed)
As above.

## 2012-02-23 NOTE — Care Management Note (Signed)
    Page 1 of 1   02/23/2012     11:44:46 AM   CARE MANAGEMENT NOTE 02/23/2012  Patient:  CASSIEL, KIS   Account Number:  [id number]  Date Initiated:  02/23/2012  Documentation initiated by:  Onnie Boer  Subjective/Objective Assessment:   PT WAS ADMITTED FOR A DECOMPRESSION     Action/Plan:   PROGRESSION OF CARE AND DISCHARGE PLANNING   Anticipated DC Date:  02/25/2012   Anticipated DC Plan:  SKILLED NURSING FACILITY  In-house referral  Clinical Social Worker      DC Planning Services  CM consult      Choice offered to / List presented to:             Status of service:  In process, will continue to follow Medicare Important Message given?   (If response is "NO", the following Medicare IM given date fields will be blank) Date Medicare IM given:   Date Additional Medicare IM given:    Discharge Disposition:    Per UR Regulation:  Reviewed for med. necessity/level of care/duration of stay  If discussed at Long Length of Stay Meetings, dates discussed:    Comments:  02/23/12 Onnie Boer, RN, BSN 1143 PT WAS ADMITTED FOR A DECOMPRESSION, PTA PT WAS AT HOME WITH SELF CARE.  PT WILL BE DC'D TO [facility].

## 2012-02-23 NOTE — Clinical Social Work Note (Signed)
Clinical Social Work  Pt is ready for discharge to [facility]. Facility has received discharge summary and is ready to admit pt. Pt and pt's daughter are agreeable to discharge plan. [facility] will provide transportation to facility. CSW is signing off as no further needs identified.   Dede Query, MSW, Theresia Majors [phone]

## 2012-02-23 NOTE — Progress Notes (Signed)
Pt stable and ready for transfer to SNF. Discharge teaching and education complete. Report give to nurse at [facility]. Pt awaiting transport

## 2012-03-15 NOTE — Discharge Summary (Signed)
As above.

## 2012-03-31 ENCOUNTER — Telehealth: Payer: Self-pay

## 2012-03-31 MED ORDER — CIPROFLOXACIN HCL 250 MG PO TABS: 250.0000 mg | ORAL_TABLET | Freq: Two times a day (BID) | ORAL | Status: DC

## 2012-03-31 NOTE — Telephone Encounter (Signed)
Patient of Dr Cleta Alberts, please advise if we can treat the UTI, I have urine culture on my desk. Amy

## 2012-03-31 NOTE — Telephone Encounter (Signed)
Spoke with Miszi, advised Rx at pharmacy.

## 2012-03-31 NOTE — Telephone Encounter (Signed)
Cipro 250mg  BID x 5 days sent to [facility]

## 2012-03-31 NOTE — Telephone Encounter (Addendum)
The patient's daughter called to request that [facility] write rx for antibiotic for UTI.  The patient was released from [facility] a few days ago, but did the urinalysis and it was positive for UTI.  [facility] is faxing over results of urinalysis and would like to have medicine called into [facility] pharmacy in [place].  Please call the patient's daughter [name] to discuss at [phone].  @2 :38pm--Fax received from [facility]-given to [facility] at [employer].

## 2012-04-01 ENCOUNTER — Telehealth: Payer: Self-pay

## 2012-04-01 NOTE — Telephone Encounter (Signed)
I called Sheri Shepherd and advised OT/PT orders need to come from her neck surgeon, she just had extensive neck surgery, and I am not sure what Dr Venetia Maxon wants her to do with OT/PT. FYI only

## 2012-04-01 NOTE — Telephone Encounter (Signed)
Bonita Quin from [facility] called to request new verbal order to start home health care on 04/11/12 per daughter [name]'s request.  They will need verbal order for nursing care, OT and PT.  Please call Bonita Quin at [phone].

## 2012-04-06 ENCOUNTER — Telehealth: Payer: Self-pay | Admitting: Radiology

## 2012-04-06 NOTE — Telephone Encounter (Signed)
I spoke to [facility] about mom, she is going to take her back to [facility] , I will have Dr Cleta Alberts complete new FL2 for her and then she can take her. She will complete the Cipro today, so I do not need to add this to the [facility] Amy

## 2012-05-31 ENCOUNTER — Telehealth: Payer: Self-pay

## 2012-05-31 NOTE — Telephone Encounter (Signed)
The patient's daughter called to request refill of generic Aricept-(Donepezil HCL) 5 mg tablet for her mother.  The patient has been at [facility] and is a patient of Dr. Cleta Alberts.  The patient's daughter stated that [facility] told her to request refill from her PCP.  Please call daughter Rhett Bannister at  [phone] cell or at home at [phone].

## 2012-05-31 NOTE — Telephone Encounter (Signed)
Dr. Cleta Alberts, please advise on refilling Aricept.  I do not see it on her medication list.  Thank you!

## 2012-05-31 NOTE — Telephone Encounter (Signed)
Okay to call in prescription for aricept 5 mg one a day #30 refill x1 year. You can call the patient's daughter for instructions about how this is done. Her daughter is [name] [phone]

## 2012-06-01 MED ORDER — DONEPEZIL HCL 5 MG PO TABS: 5.0000 mg | ORAL_TABLET | Freq: Every evening | ORAL | Status: DC | PRN

## 2012-06-01 NOTE — Telephone Encounter (Signed)
Rx sent in

## 2012-06-02 NOTE — Telephone Encounter (Signed)
Spoke to [employer] about her Mom and advised this was sent in for 1 year. Mom is improving getting stronger.

## 2012-08-18 ENCOUNTER — Telehealth: Payer: Self-pay

## 2012-08-18 NOTE — Telephone Encounter (Signed)
Pended RX to send for her. If appropriate please sign

## 2012-08-18 NOTE — Telephone Encounter (Signed)
Patient had a neck fusion in Sept by Dr. Venetia Maxon.    Dr. Cleta Alberts manages patients RX - daughter requesting oxyCODONE-acetaminophen (PERCOCET/ROXICET) 5-325 MG per tablet 30 tablets to take as needed for pain   Sheri Shepherd [phone]

## 2012-08-18 NOTE — Telephone Encounter (Signed)
OK to refill

## 2012-08-18 NOTE — Telephone Encounter (Signed)
Please sign the Rx it is in my box

## 2012-09-08 ENCOUNTER — Telehealth: Payer: Self-pay

## 2012-09-08 NOTE — Telephone Encounter (Signed)
Called [name], left message for her to call me back.

## 2012-09-08 NOTE — Telephone Encounter (Signed)
[name] CB and was asking if Dr Cleta Alberts could RF pt's Percocet that she takes occasionally for pain. Dr Cleta Alberts had approved the RF 08/18/12 phone message, but I do not see the Rx either in p/up drawer of in Amy's box. [name] stated that noone ever called her back at that time and we normally do when Rx is ready. Dr Cleta Alberts, do you know where the Rx is that was written in March, or do you want to rewrite it?

## 2012-09-08 NOTE — Telephone Encounter (Signed)
[name] WOULD LIKE TO SPEAK WITH SOMEONE REGARDING HER MOM MEDS. PLEASE CALL [phone]

## 2012-09-09 MED ORDER — OXYCODONE-ACETAMINOPHEN 5-325 MG PO TABS: 1.0000 | ORAL_TABLET | Freq: Three times a day (TID) | ORAL | Status: DC | PRN

## 2012-09-09 NOTE — Telephone Encounter (Signed)
Okay to reprint the prescription and get it to me and I will sign it.

## 2012-09-09 NOTE — Telephone Encounter (Signed)
Patient's daughter advised.  

## 2012-09-09 NOTE — Telephone Encounter (Signed)
reprinted

## 2012-09-26 ENCOUNTER — Ambulatory Visit: Payer: Medicare Other | Admitting: Emergency Medicine

## 2012-10-03 ENCOUNTER — Ambulatory Visit (INDEPENDENT_AMBULATORY_CARE_PROVIDER_SITE_OTHER): Payer: Medicare Other | Admitting: Emergency Medicine

## 2012-10-03 ENCOUNTER — Encounter: Payer: Self-pay | Admitting: Emergency Medicine

## 2012-10-03 VITALS — BP 198/86 | HR 73 | Temp 98.0°F | Resp 16 | Ht 62.5 in | Wt 117.0 lb

## 2012-10-03 DIAGNOSIS — E039 Hypothyroidism, unspecified: Secondary | ICD-10-CM

## 2012-10-03 DIAGNOSIS — C50919 Malignant neoplasm of unspecified site of unspecified female breast: Secondary | ICD-10-CM | POA: Insufficient documentation

## 2012-10-03 DIAGNOSIS — C50912 Malignant neoplasm of unspecified site of left female breast: Secondary | ICD-10-CM

## 2012-10-03 DIAGNOSIS — F0391 Unspecified dementia with behavioral disturbance: Secondary | ICD-10-CM

## 2012-10-03 DIAGNOSIS — R279 Unspecified lack of coordination: Secondary | ICD-10-CM

## 2012-10-03 DIAGNOSIS — F03918 Unspecified dementia, unspecified severity, with other behavioral disturbance: Secondary | ICD-10-CM

## 2012-10-03 DIAGNOSIS — M542 Cervicalgia: Secondary | ICD-10-CM

## 2012-10-03 DIAGNOSIS — I10 Essential (primary) hypertension: Secondary | ICD-10-CM

## 2012-10-03 DIAGNOSIS — R5381 Other malaise: Secondary | ICD-10-CM

## 2012-10-03 LAB — COMPREHENSIVE METABOLIC PANEL
Albumin: 3.9 g/dL (ref 3.5–5.2)
Alkaline Phosphatase: 91 U/L (ref 39–117)
BUN: 8 mg/dL (ref 6–23)
Creat: 0.8 mg/dL (ref 0.50–1.10)
Glucose, Bld: 98 mg/dL (ref 70–99)
Potassium: 4.4 mEq/L (ref 3.5–5.3)
Total Bilirubin: 0.7 mg/dL (ref 0.3–1.2)

## 2012-10-03 LAB — CBC WITH DIFFERENTIAL/PLATELET
Basophils Absolute: 0 10*3/uL (ref 0.0–0.1)
Basophils Relative: 0 % (ref 0–1)
Eosinophils Absolute: 0 10*3/uL (ref 0.0–0.7)
Eosinophils Relative: 1 % (ref 0–5)
HCT: 41.7 % (ref 36.0–46.0)
MCHC: 32.9 g/dL (ref 30.0–36.0)
MCV: 84.1 fL (ref 78.0–100.0)
Monocytes Absolute: 0.3 10*3/uL (ref 0.1–1.0)
RDW: 14.7 % (ref 11.5–15.5)

## 2012-10-03 MED ORDER — LEVOTHYROXINE SODIUM 75 MCG PO TABS: 75.0000 ug | ORAL_TABLET | Freq: Every day | ORAL | Status: DC

## 2012-10-03 MED ORDER — RAMIPRIL 5 MG PO CAPS: 5.0000 mg | ORAL_CAPSULE | Freq: Every day | ORAL | Status: DC

## 2012-10-03 MED ORDER — OXYCODONE-ACETAMINOPHEN 5-325 MG PO TABS: 1.0000 | ORAL_TABLET | Freq: Three times a day (TID) | ORAL | Status: DC | PRN

## 2012-10-03 MED ORDER — AMLODIPINE BESYLATE 2.5 MG PO TABS: 2.5000 mg | ORAL_TABLET | Freq: Every day | ORAL | Status: DC

## 2012-10-03 MED ORDER — DONEPEZIL HCL 5 MG PO TABS: 5.0000 mg | ORAL_TABLET | Freq: Every evening | ORAL | Status: DC | PRN

## 2012-10-03 NOTE — Progress Notes (Signed)
  Subjective:    Patient ID: Sheri Shepherd, female    DOB: 1932-06-11, 79 y.o.   MRN: [id number]  HPI patient here to followup on thyroid disease and hypertension. Since her last visit here she has had repair of a cervical disc. She is overall doing well. Her daughter and granddaughters check on her frequently. She is currently living at home and happy with that situation. The daughter states she's been checking her pressures at home and they have been acceptable. She has always been resistant to taking a higher dose of blood pressure medication and resistant to taking a higher dose of thyroid medication.    Review of Systems     Objective:   Physical Exam patient is alert and cooperative. She has a significant hearing impairment. There are no carotid bruits. Her chest was clear there is a 2/6 systolic murmur at the lateral border. The abdomen is soft and nontender. The right breast is normal. The left breast has been removed and there is an implant present. Extremities are without edema        Assessment & Plan:  Routine labs were drawn. I advised the family to get a life alert button for her. All meds refilled. Her blood pressure is not at goal but she has always been resistant to taking more medication. We'll continue her Altace, Synthroid. and Aricept. I have added Norvasc 2.5 mg one a day to see if we needed better blood pressure control and encourage daughter to give her boost or Ensure to help with her

## 2012-10-04 ENCOUNTER — Other Ambulatory Visit: Payer: Self-pay | Admitting: Radiology

## 2012-10-04 MED ORDER — LEVOTHYROXINE SODIUM 88 MCG PO TABS: 88.0000 ug | ORAL_TABLET | Freq: Every day | ORAL | Status: DC

## 2012-12-01 ENCOUNTER — Telehealth: Payer: Self-pay

## 2012-12-01 DIAGNOSIS — M542 Cervicalgia: Secondary | ICD-10-CM

## 2012-12-01 MED ORDER — OXYCODONE-ACETAMINOPHEN 5-325 MG PO TABS: 1.0000 | ORAL_TABLET | Freq: Three times a day (TID) | ORAL | Status: DC | PRN

## 2012-12-01 NOTE — Telephone Encounter (Signed)
Pt's daughter Marin Comment states that pt needs a refill on oxycodone. Best# [phone]

## 2012-12-01 NOTE — Telephone Encounter (Signed)
Rx at front desk for pick up daughter advised.

## 2012-12-01 NOTE — Telephone Encounter (Signed)
Printed

## 2012-12-01 NOTE — Telephone Encounter (Signed)
It is okay to give her refill on her oxycodone. If you print it out I can sign it.

## 2012-12-01 NOTE — Telephone Encounter (Signed)
Pended please advise.  

## 2013-01-03 ENCOUNTER — Ambulatory Visit: Payer: Medicare Other | Admitting: Emergency Medicine

## 2013-01-05 ENCOUNTER — Telehealth: Payer: Self-pay

## 2013-01-05 DIAGNOSIS — M542 Cervicalgia: Secondary | ICD-10-CM

## 2013-01-05 NOTE — Telephone Encounter (Signed)
Pt's daughter, Emilie Rutter called to ask for a refill on oxycodone. Mitzie said that usually Dr Cleta Alberts will refill for her when she calls.  [phone]

## 2013-01-06 MED ORDER — OXYCODONE-ACETAMINOPHEN 5-325 MG PO TABS: 1.0000 | ORAL_TABLET | Freq: Three times a day (TID) | ORAL | Status: DC | PRN

## 2013-01-06 NOTE — Telephone Encounter (Signed)
print out a prescription for oxycodone the same as her last prescription. It is oxycodone 5 mg one half to one every 8 hours as needed for neck pain. She can have #30

## 2013-01-06 NOTE — Telephone Encounter (Signed)
Called to advise the Rx ready for pick up

## 2013-01-06 NOTE — Telephone Encounter (Signed)
Printed, will call daughter to advise when signed.

## 2013-02-20 ENCOUNTER — Other Ambulatory Visit: Payer: Self-pay | Admitting: Emergency Medicine

## 2013-03-10 ENCOUNTER — Telehealth: Payer: Self-pay

## 2013-03-10 ENCOUNTER — Other Ambulatory Visit: Payer: Self-pay | Admitting: Emergency Medicine

## 2013-03-10 DIAGNOSIS — M542 Cervicalgia: Secondary | ICD-10-CM

## 2013-03-10 MED ORDER — OXYCODONE-ACETAMINOPHEN 5-325 MG PO TABS: 1.0000 | ORAL_TABLET | Freq: Three times a day (TID) | ORAL | Status: DC | PRN

## 2013-03-10 NOTE — Telephone Encounter (Addendum)
Patient's daughter states that her mother needs a refill on Percocet. States that she is normally the one who picks the prescription from our office and she is going out of town soon. Could she get this today? Patient has a few pills left. Also, I corrected patient's date of birth to December 22, 1932 (originally in [place] as 01-21-1933 however daughter confirmed that it is [birth year]).   Please call Rhett Bannister [phone]

## 2013-03-10 NOTE — Telephone Encounter (Signed)
Pended please advise on refill of Oxycodone, daughter wants to pick up today

## 2013-03-14 ENCOUNTER — Ambulatory Visit: Payer: Medicare Other | Admitting: Emergency Medicine

## 2013-03-29 ENCOUNTER — Telehealth: Payer: Self-pay

## 2013-03-29 NOTE — Telephone Encounter (Signed)
Received notice from [facility] that pt has not gotten RF of ramipril recently and may not be taking it. Spoke w/pt's daughter and she reported that pt is taking as Rxd. When pt was at nursing home they sent home a large supply of it and she hasn't needed a RF yet. Daughter thanked me for calling to check.

## 2013-04-25 ENCOUNTER — Telehealth: Payer: Self-pay

## 2013-04-25 NOTE — Telephone Encounter (Signed)
Daughter mitzi vernon calling to request hydrocodone for her neck pain, she says amy helps her once a month please call [phone]

## 2013-04-26 ENCOUNTER — Other Ambulatory Visit: Payer: Self-pay | Admitting: Radiology

## 2013-04-26 ENCOUNTER — Other Ambulatory Visit: Payer: Self-pay | Admitting: Emergency Medicine

## 2013-04-26 MED ORDER — HYDROCODONE-ACETAMINOPHEN 5-325 MG PO TABS: 1.0000 | ORAL_TABLET | ORAL | Status: DC | PRN

## 2013-06-17 ENCOUNTER — Other Ambulatory Visit: Payer: Self-pay | Admitting: Emergency Medicine

## 2013-06-28 ENCOUNTER — Telehealth: Payer: Self-pay

## 2013-06-28 DIAGNOSIS — F0391 Unspecified dementia with behavioral disturbance: Secondary | ICD-10-CM

## 2013-06-28 DIAGNOSIS — F03918 Unspecified dementia, unspecified severity, with other behavioral disturbance: Secondary | ICD-10-CM

## 2013-06-28 NOTE — Telephone Encounter (Addendum)
Sheri Shepherd STATES WE USUALLY CALL HER MOM IN SOME MEDICINE TO HELP HER SLEEP AT NIGHT AND SHE ONLY HAVE A FEW LEFT PLEASE CALL [phone] AND SHE WILL COME PICK UP

## 2013-06-29 ENCOUNTER — Other Ambulatory Visit: Payer: Self-pay | Admitting: Emergency Medicine

## 2013-06-29 DIAGNOSIS — M542 Cervicalgia: Secondary | ICD-10-CM

## 2013-06-29 NOTE — Telephone Encounter (Signed)
Please call patient and ask her which prescription she is asking for

## 2013-06-30 ENCOUNTER — Other Ambulatory Visit: Payer: Self-pay | Admitting: Emergency Medicine

## 2013-06-30 MED ORDER — HYDROCODONE-ACETAMINOPHEN 5-325 MG PO TABS: ORAL_TABLET | ORAL | Status: DC

## 2013-06-30 NOTE — Telephone Encounter (Signed)
RX ready for p/u. [name] notified

## 2013-06-30 NOTE — Telephone Encounter (Signed)
Spoke with Sheri Shepherd and she states either the hydrocodone or oxycodone. States last time it was hydrocodone and you wrote for a 2 month supply so would like 2 month supply on either of those.

## 2013-07-03 ENCOUNTER — Other Ambulatory Visit: Payer: Self-pay | Admitting: Physician Assistant

## 2013-08-17 ENCOUNTER — Telehealth: Payer: Self-pay

## 2013-08-17 NOTE — Telephone Encounter (Signed)
Patient's daughter called and states that she herself was diagnosed with the flu yesterday. She states that her mother is also experiencing flu symptoms that started today. She wants to know if it would be appropriate to give her mother tamiflu.

## 2013-08-17 NOTE — Telephone Encounter (Signed)
Lm for rtn call. pt to come into clinic for evaluation.

## 2013-08-18 MED ORDER — OSELTAMIVIR PHOSPHATE 75 MG PO CAPS: 75.0000 mg | ORAL_CAPSULE | Freq: Two times a day (BID) | ORAL | Status: DC

## 2013-08-18 NOTE — Telephone Encounter (Signed)
Sheri Shepherd called and her mother has a fever of 100.9 since yesterday morning. She has a cough and some body aches. No vomiting or diarrhea. [name] was diagnosed with Flu A on Wednesday. [name] stays with her mother frequently.   She does not have a way to get here since Sheri Shepherd is sick with the flu herself. I have pended the Tamiflu.  [facility] [address]

## 2013-08-18 NOTE — Telephone Encounter (Signed)
Spoke to patients daughter, she is insistent that she cannot come in today and bring her mother. Daughter was diagnosed with Flu A and her mother started with symptoms yesterday. She wants to know if she can start her mother on Tamiflu until she feels good enough to bring her mother in.

## 2013-08-18 NOTE — Telephone Encounter (Signed)
Medication approved and sent to the pharmacy

## 2013-08-28 ENCOUNTER — Other Ambulatory Visit: Payer: Self-pay

## 2013-08-28 DIAGNOSIS — M542 Cervicalgia: Secondary | ICD-10-CM

## 2013-08-28 MED ORDER — HYDROCODONE-ACETAMINOPHEN 5-325 MG PO TABS: ORAL_TABLET | ORAL | Status: DC

## 2013-08-28 NOTE — Telephone Encounter (Signed)
Patients daughter called back to speak to Clarise Cruz, told her she was away at lunch and to leave a message for her to call back. She wanted to let sara know that her mother see's Daub for pain on her neck from a total neck fusion. Her mother needs pain medication refilled oxycodone or hydrocodone? She was unsure which one Daub has her on currently. Please call back.   At [phone]

## 2013-08-28 NOTE — Telephone Encounter (Signed)
Patients Daughter Mitzy wanting to speak to Clarise Cruz about her mother and medication refill for either hydrocodone or oxycodone. Please advise.   Call back at: [phone]

## 2013-08-28 NOTE — Telephone Encounter (Signed)
Duplicate message- See other telephone calls.

## 2013-08-28 NOTE — Telephone Encounter (Signed)
Pt needs a refill of her Hydrocodone-  Pt due for labs and follow up- transferred to schedule an appt with Dr. Everlene Farrier for April.

## 2013-08-29 NOTE — Telephone Encounter (Signed)
Pt notified that rx is up front for p/u 

## 2013-10-02 ENCOUNTER — Other Ambulatory Visit: Payer: Self-pay | Admitting: Physician Assistant

## 2013-10-03 NOTE — Telephone Encounter (Signed)
Dr Everlene Farrier, pt hasn't been in for over a year, but does have appt scheduled for 11/14/13. Do you want to RF until then?

## 2013-10-24 ENCOUNTER — Other Ambulatory Visit: Payer: Self-pay | Admitting: Emergency Medicine

## 2013-10-27 ENCOUNTER — Telehealth: Payer: Self-pay

## 2013-10-27 NOTE — Telephone Encounter (Signed)
pts daughter Rae Halsted called and states her mother is requesting a refill from dr Everlene Farrier for hydrocodone.

## 2013-10-28 MED ORDER — HYDROCODONE-ACETAMINOPHEN 5-325 MG PO TABS: ORAL_TABLET | ORAL | Status: DC

## 2013-10-28 NOTE — Telephone Encounter (Signed)
Rx printed. LM for [name] advised ready for pick up.

## 2013-10-28 NOTE — Telephone Encounter (Signed)
Please print out the prescription I will sign it.

## 2013-11-14 ENCOUNTER — Ambulatory Visit: Payer: Medicare Other | Admitting: Emergency Medicine

## 2013-11-21 ENCOUNTER — Other Ambulatory Visit: Payer: Self-pay | Admitting: Emergency Medicine

## 2013-11-21 ENCOUNTER — Encounter: Payer: Self-pay | Admitting: Emergency Medicine

## 2013-11-21 ENCOUNTER — Ambulatory Visit (INDEPENDENT_AMBULATORY_CARE_PROVIDER_SITE_OTHER): Payer: Medicare Other | Admitting: Emergency Medicine

## 2013-11-21 VITALS — BP 160/68 | HR 90 | Temp 99.1°F | Resp 16 | Ht 62.0 in | Wt 118.2 lb

## 2013-11-21 DIAGNOSIS — R350 Frequency of micturition: Secondary | ICD-10-CM

## 2013-11-21 DIAGNOSIS — I10 Essential (primary) hypertension: Secondary | ICD-10-CM

## 2013-11-21 DIAGNOSIS — Z23 Encounter for immunization: Secondary | ICD-10-CM

## 2013-11-21 DIAGNOSIS — E039 Hypothyroidism, unspecified: Secondary | ICD-10-CM

## 2013-11-21 LAB — POCT URINALYSIS DIPSTICK
Bilirubin, UA: NEGATIVE
Glucose, UA: NEGATIVE
KETONES UA: NEGATIVE
Nitrite, UA: NEGATIVE
PH UA: 7.5
Protein, UA: NEGATIVE
RBC UA: NEGATIVE
Spec Grav, UA: 1.015
Urobilinogen, UA: 0.2

## 2013-11-21 LAB — COMPLETE METABOLIC PANEL WITH GFR
ALT: 8 U/L (ref 0–35)
AST: 19 U/L (ref 0–37)
Albumin: 3.9 g/dL (ref 3.5–5.2)
Alkaline Phosphatase: 107 U/L (ref 39–117)
BUN: 7 mg/dL (ref 6–23)
CALCIUM: 9.4 mg/dL (ref 8.4–10.5)
CHLORIDE: 105 meq/L (ref 96–112)
CO2: 27 meq/L (ref 19–32)
Creat: 0.8 mg/dL (ref 0.50–1.10)
GFR, EST AFRICAN AMERICAN: 82 mL/min
GFR, Est Non African American: 71 mL/min
Glucose, Bld: 173 mg/dL — ABNORMAL HIGH (ref 70–99)
POTASSIUM: 4.5 meq/L (ref 3.5–5.3)
Sodium: 140 mEq/L (ref 135–145)
TOTAL PROTEIN: 6.6 g/dL (ref 6.0–8.3)
Total Bilirubin: 0.6 mg/dL (ref 0.2–1.2)

## 2013-11-21 LAB — POCT UA - MICROSCOPIC ONLY
CASTS, UR, LPF, POC: NEGATIVE
Crystals, Ur, HPF, POC: NEGATIVE
Mucus, UA: NEGATIVE
YEAST UA: NEGATIVE

## 2013-11-21 MED ORDER — LORAZEPAM 0.5 MG PO TABS: ORAL_TABLET | ORAL | Status: DC

## 2013-11-21 MED ORDER — HYDROCODONE-ACETAMINOPHEN 5-325 MG PO TABS: ORAL_TABLET | ORAL | Status: DC

## 2013-11-21 MED ORDER — DONEPEZIL HCL 10 MG PO TABS: 10.0000 mg | ORAL_TABLET | Freq: Every day | ORAL | Status: DC

## 2013-11-21 MED ORDER — LEVOTHYROXINE SODIUM 88 MCG PO TABS: 88.0000 ug | ORAL_TABLET | Freq: Every day | ORAL | Status: DC

## 2013-11-21 NOTE — Progress Notes (Addendum)
Subjective:    Patient ID: Sheri Shepherd, female    DOB: 12/18/1932, 81 y.o.   MRN: [id number] This chart was scribed for Darlyne Russian, MD by Anastasia Pall, ED Scribe. This patient was seen in room 21 and the patient's care was started at 4:07 PM.  Chief Complaint  Patient presents with  . Hypertension    feet swelling   . Hypothyroidism  . Medication Refill  . frequent urination x 1 week comes/goes   HPI Sheri Shepherd is a 80 y.o. female Pt has h/o dementia, hearing loss, breast cancer and left mastectomy, colon polyps, HTN, hypothyroidism, and urinary frequency.  She presents for a check up, bilateral foot swelling, and 1 week of intermittent urinary frequency.  Daughter states pt lives a few miles away from her, but states she will stay with her frequently. She states pt's daughter and granddaughter will bring pt food frequently. Pt reports her neck has been doing well. She denies recent falls. Daughter reports given pt Hydrocodone as needed. Daughter states pt goes to [facility], will schedule pt an appointment for mammogram. Daughter states pt just received brand new hearing aid. Pt states she feels like she has been doing pretty well overall lately.   Daughter reports pt is on Aricept, may need to up the dosage a bit.    Patient Active Problem List   Diagnosis Date Noted  . Breast cancer 10/03/2012  . Abdominal pain 01/14/2012  . Nausea & vomiting 01/14/2012  . HTN (hypertension) 01/14/2012  . Personal history of colonic polyps 01/14/2012  . Diverticulosis 01/14/2012  . Neck pain 01/14/2012   Past Medical History  Diagnosis Date  . Back pain   . History of colonic polyps   . Hx of thyroid nodule   . PONV (postoperative nausea and vomiting)     Pt is "groggy" a while after surgery   . Memory loss   . Bronchitis     hx of  . Vertigo   . Hearing loss     bilateral ear  . Diverticulosis   . Frequency of urination   . Bruises easily   . Cancer     left  Breast   . Hypertension   . Hypothyroid   . Anxiety    Past Surgical History  Procedure Laterality Date  . Cervical fusion      x2  . Thyroidectomy, partial    . Abdominal hysterectomy  1978  . Colonoscopy    . Cataract extraction, bilateral    . Appendectomy    . Eye surgery    . Mastectomy  1999    Left  . Tonsillectomy    . Posterior cervical fusion/foraminotomy  02/18/2012    Procedure: POSTERIOR CERVICAL FUSION/FORAMINOTOMY LEVEL 4;  Surgeon: Erline Levine, MD;  Location: [facility];  Service: Neurosurgery;  Laterality: N/A;  Cervical four - seven  Posterior cervical decompression/fusion   Allergies  Allergen Reactions  . Lyrica [Pregabalin]   . Penicillins Other (See Comments)    Heart palpitations  . Prednisone     Elevated heartrate.  . Tramadol Other (See Comments)    Severe mood swings and confusion  . Sulfa Antibiotics Rash    Red, swollen areas as well.Current Outpatient Prescriptions on File Prior to Visit: levothyroxine (SYNTHROID, LEVOTHROID) 75 MCG tablet, Take 1 tablet (75 mcg total) by mouth daily. Needs office visit with labs., Disp: 30 tablet, Rfl: 3 Red swollen areas as well.     Prior  to Admission medications   Medication Sig Start Date End Date Taking? Authorizing Tayden Nichelson  amLODipine (NORVASC) 2.5 MG tablet Take 1 tablet (2.5 mg total) by mouth daily. 10/03/12   Darlyne Russian, MD  aspirin 325 MG tablet Take 325 mg by mouth daily as needed. For pain    Historical Saavi Mceachron, MD  ciprofloxacin (CIPRO) 250 MG tablet Take 1 tablet (250 mg total) by mouth 2 (two) times daily. 03/31/12   Eleanore Kurtis Bushman, PA-C  donepezil (ARICEPT) 5 MG tablet TAKE 1 TABLET BY MOUTH AT BEDTIME AS NEEDED 10/24/13   Darlyne Russian, MD  HYDROcodone-acetaminophen (NORCO/VICODIN) 5-325 MG per tablet One half to one tablet every 8-12 hours as needed for pain 10/28/13   Darlyne Russian, MD  methocarbamol (ROBAXIN) 500 MG tablet Take 1 tablet (500 mg total) by mouth 4 (four) times daily.  02/23/12   Erline Levine, MD  Multiple Vitamin (MULTIVITAMIN WITH MINERALS) TABS Take 1 tablet by mouth daily.    Historical Viren Lebeau, MD  oseltamivir (TAMIFLU) 75 MG capsule Take 1 capsule (75 mg total) by mouth 2 (two) times daily. 08/18/13   Darlyne Russian, MD  oxyCODONE-acetaminophen (PERCOCET/ROXICET) 5-325 MG per tablet Take 1 tablet by mouth every 8 (eight) hours as needed. 03/10/13   Darlyne Russian, MD  ramipril (ALTACE) 5 MG capsule TAKE 1 CAPSULE BY MOUTH EVERY DAY 10/24/13   Darlyne Russian, MD  SYNTHROID 88 MCG tablet TAKE AS DIRECTED, NEED OFFICE VISIT FOR FURTHER REFILLS PER MD    Darlyne Russian, MD    Review of Systems  Constitutional: Negative for fever, chills and diaphoresis.  Cardiovascular: Positive for leg swelling (bilateral feet).  Genitourinary: Positive for frequency. Negative for dysuria.  Musculoskeletal: Negative for back pain and neck pain.  Skin: Negative for color change and wound.      Objective:   Physical Exam Nursing note and vitals reviewed. Constitutional: She is oriented to person, place, and time. No distress.  HENT:  Head: Normocephalic and atraumatic.  Eyes: Conjunctivae and EOM are normal. Pupils are equal, round, and reactive to light. No scleral icterus.  Neck: Neck supple. No thyromegaly present.  Kyphotic curve in neck.   Cardiovascular: Normal rate, regular rhythm, normal heart sounds and intact distal pulses.   No murmur heard. Pulmonary/Chest: Effort normal and breath sounds normal. No respiratory distress. She exhibits no tenderness.  Left mastectomy scar and prosthetic left breast.   Musculoskeletal: Normal range of motion. She exhibits edema (1+ edema bilateral LE).  Lymphadenopathy:    She has no cervical adenopathy.  Neurological: She is alert and oriented to person, place, and time.  Skin: Skin is warm and dry.  Psychiatric: She has a normal mood and affect. Her behavior is normal.  Results for orders placed in visit on 11/21/13  POCT  UA - MICROSCOPIC ONLY      Result Value Ref Range   WBC, Ur, HPF, POC 0-1     RBC, urine, microscopic 5-6     Bacteria, U Microscopic 1+     Mucus, UA neg     Epithelial cells, urine per micros 0-1     Crystals, Ur, HPF, POC neg     Casts, Ur, LPF, POC neg     Yeast, UA neg    POCT URINALYSIS DIPSTICK      Result Value Ref Range   Color, UA yellow     Clarity, UA clear     Glucose, UA neg  Bilirubin, UA neg     Ketones, UA neg     Spec Grav, UA 1.015     Blood, UA neg     pH, UA 7.5     Protein, UA neg     Urobilinogen, UA 0.2     Nitrite, UA neg     Leukocytes, UA small (1+)     BP 160/68  Pulse 90  Temp(Src) 99.1 F (37.3 C) (Oral)  Resp 16  Ht 5\' 2"  (1.575 m)  Wt 118 lb 3.2 oz (53.615 kg)  BMI 21.61 kg/m2  SpO2 96%     Assessment & Plan:  I did increase her Aricept to 10 mg a day she was given a limited number of Ativan 0.5 take a half one if she became agitated. Her hydrocodone was refilled for severe back pain when it occurs. She was given Prevnar pneumococcal vaccine today. We'll recheck in 6 months. Her Synthroid was refilled. CBC could not be run but her other blood work was ordered. When she comes back in 6 months with a CBC at that time. Her CBC 1 year ago was normal . Her daughter will order her followup mammogram which is due. She has a history of breast cancer with mastectomy. I personally performed the services described in this documentation, which was scribed in my presence. The recorded information has been reviewed and is accurate.

## 2013-11-22 LAB — T4, FREE: Free T4: 1.11 ng/dL (ref 0.80–1.80)

## 2013-11-22 LAB — TSH: TSH: 5.57 u[IU]/mL — ABNORMAL HIGH (ref 0.350–4.500)

## 2013-11-23 LAB — URINE CULTURE: Colony Count: 8000

## 2013-11-23 LAB — T4, FREE: Free T4: 1.07 ng/dL (ref 0.80–1.80)

## 2013-12-14 ENCOUNTER — Other Ambulatory Visit: Payer: Self-pay | Admitting: Family Medicine

## 2013-12-14 ENCOUNTER — Telehealth: Payer: Self-pay | Admitting: Family Medicine

## 2013-12-14 DIAGNOSIS — I10 Essential (primary) hypertension: Secondary | ICD-10-CM

## 2013-12-14 MED ORDER — RAMIPRIL 5 MG PO CAPS: ORAL_CAPSULE | ORAL | Status: DC

## 2013-12-14 NOTE — Telephone Encounter (Signed)
Please print out the prescriptions and I will sign them tomorrow in the office

## 2013-12-14 NOTE — Telephone Encounter (Signed)
Refill lorazepam Please advise

## 2014-01-02 ENCOUNTER — Other Ambulatory Visit: Payer: Self-pay | Admitting: Emergency Medicine

## 2014-01-09 ENCOUNTER — Telehealth: Payer: Self-pay

## 2014-01-09 NOTE — Telephone Encounter (Signed)
Patients daughter thinks her mother has shingles and wants to know if Dr Everlene Farrier can recommend something for her mother. She says her mother is hard to transport and would prefer not to bring her in

## 2014-01-10 ENCOUNTER — Other Ambulatory Visit: Payer: Self-pay | Admitting: *Deleted

## 2014-01-10 MED ORDER — HYDROCODONE-ACETAMINOPHEN 5-325 MG PO TABS: ORAL_TABLET | ORAL | Status: DC

## 2014-01-10 MED ORDER — VALACYCLOVIR HCL 1 G PO TABS: 1000.0000 mg | ORAL_TABLET | Freq: Three times a day (TID) | ORAL | Status: DC

## 2014-01-10 NOTE — Telephone Encounter (Signed)
As we discussed we will call in valtrex and hydrocodone for pain. The rash does look like shingles

## 2014-01-10 NOTE — Telephone Encounter (Signed)
Pt has been advised- script sent to pharmacy.

## 2014-01-10 NOTE — Telephone Encounter (Signed)
Sheri Shepherd is going to email pictures of the rash to my email- will forward to Dr. Everlene Farrier when received. She states it is very difficult to get her mother out of the house. The rash is very similar, almost identical to the rash her aunt had last year and was diagnosed with Shingles.  Daughter would like to ask for Dr. Everlene Farrier to send the antiviral and some pain medication the the pharmacy for [facility].    [facility] in [place], [place]

## 2014-01-10 NOTE — Telephone Encounter (Signed)
Sent in Valtrex for pt due to rash on her back- email sent with pictures to Dr. Everlene Farrier. Authorized refill of pain medication.

## 2014-03-20 ENCOUNTER — Telehealth: Payer: Self-pay

## 2014-03-20 ENCOUNTER — Other Ambulatory Visit: Payer: Self-pay | Admitting: Emergency Medicine

## 2014-03-20 DIAGNOSIS — M542 Cervicalgia: Secondary | ICD-10-CM

## 2014-03-20 MED ORDER — HYDROCODONE-ACETAMINOPHEN 5-325 MG PO TABS: ORAL_TABLET | ORAL | Status: DC

## 2014-03-20 MED ORDER — OXYCODONE-ACETAMINOPHEN 5-325 MG PO TABS: 1.0000 | ORAL_TABLET | Freq: Three times a day (TID) | ORAL | Status: DC | PRN

## 2014-03-20 NOTE — Telephone Encounter (Signed)
Refill on hydrocodone or oxycodone

## 2014-03-21 NOTE — Telephone Encounter (Signed)
Called [name] advised Rx ready for pick up is at [address]

## 2014-05-18 ENCOUNTER — Other Ambulatory Visit: Payer: Self-pay | Admitting: Radiology

## 2014-05-18 NOTE — Telephone Encounter (Signed)
Sheri Shepherd called, patient needs renewal on meds for sleep, pended please advise.

## 2014-05-19 MED ORDER — LORAZEPAM 0.5 MG PO TABS: ORAL_TABLET | ORAL | Status: DC

## 2014-05-19 NOTE — Telephone Encounter (Signed)
Faxed

## 2014-05-22 ENCOUNTER — Telehealth: Payer: Self-pay

## 2014-05-22 ENCOUNTER — Other Ambulatory Visit: Payer: Self-pay | Admitting: Radiology

## 2014-05-22 MED ORDER — HYDROCODONE-ACETAMINOPHEN 5-325 MG PO TABS: ORAL_TABLET | ORAL | Status: DC

## 2014-05-22 NOTE — Telephone Encounter (Signed)
[name] called in on behlalf of pt, states that she spoke with Amy L last week about having an rx for HYDROcodone-acetaminophen (NORCO/VICODIN) 5-325 MG per tablet [[id number]] , and that she was told that Dr. Everlene Farrier would sign off on this and put it upfront, please advise pt.

## 2014-05-22 NOTE — Telephone Encounter (Signed)
Patient needs hydrocodone renewed.

## 2014-05-23 ENCOUNTER — Other Ambulatory Visit: Payer: Self-pay | Admitting: Emergency Medicine

## 2014-05-23 NOTE — Telephone Encounter (Signed)
I believe this was done yesterday. Please check with Amy.

## 2014-05-23 NOTE — Telephone Encounter (Signed)
Yes, done, patients daughter picked up yesterday

## 2014-06-05 IMAGING — CT CT ABD-PELV W/ CM
1 of 3 series · 14 of 32 positions shown, 19 images · IV contrast (omnipaque)
Comparison: None.

CLINICAL DATA: Lower abdominal pain.  Nausea and vomiting.  Chronic
back pain.

CT ABDOMEN AND PELVIS WITH CONTRAST
TECHNIQUE: Multidetector CT imaging of the abdomen and pelvis was
performed following the standard protocol during bolus
administration of intravenous contrast.
Contrast: 100mL OMNIPAQUE IOHEXOL 300 MG/ML  SOLN

[Series 2: abd/pel with · axial · 0.74mm/px · z∈[-402,-27]mm · 14 of 85 slices shown, 19 images]
[im 5/85  soft-tissue]
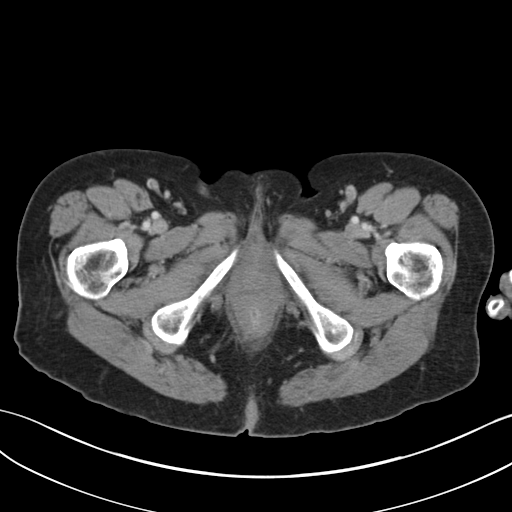
[im 5/85  bone]
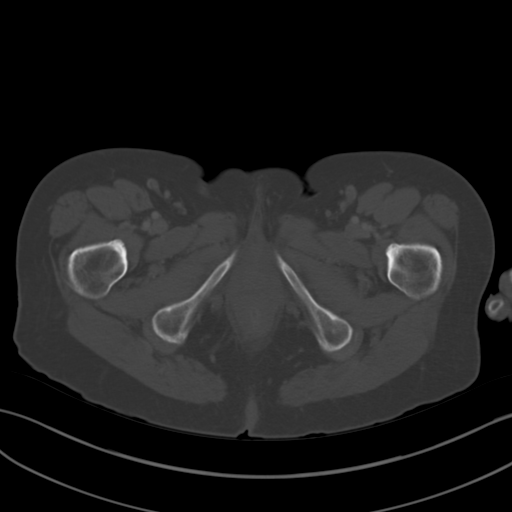
[im 10/85  soft-tissue]
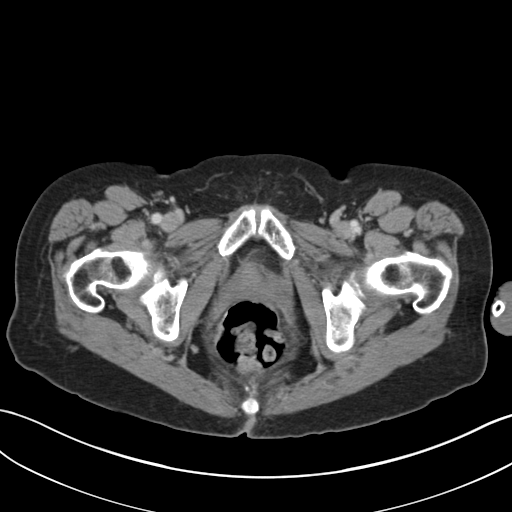
[im 19/85  soft-tissue]
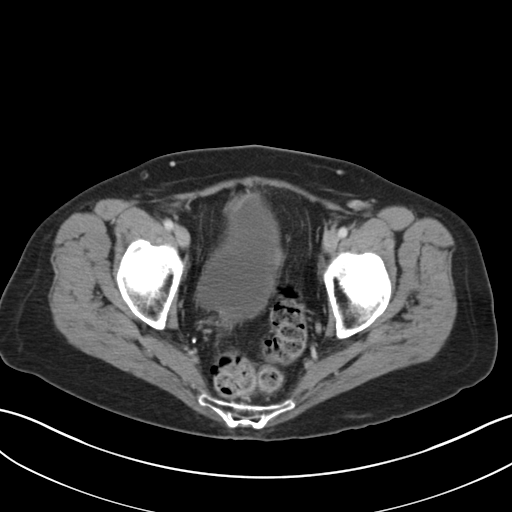
[im 24/85  soft-tissue]
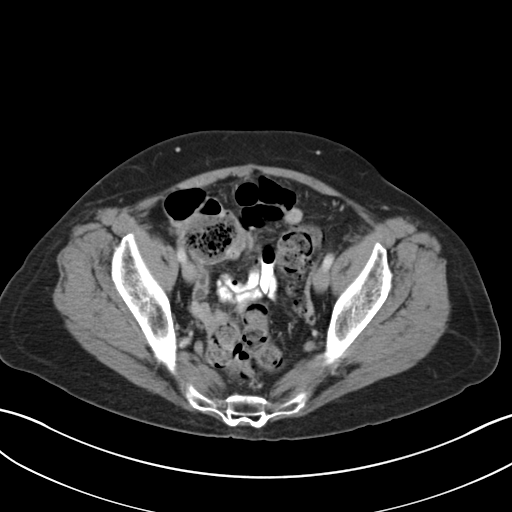
[im 29/85  soft-tissue]
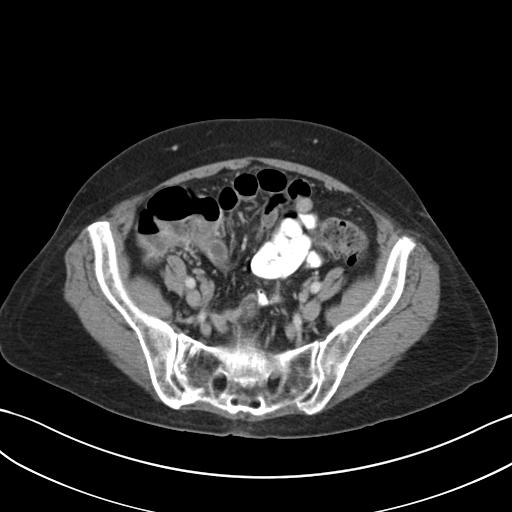
[im 38/85  soft-tissue]
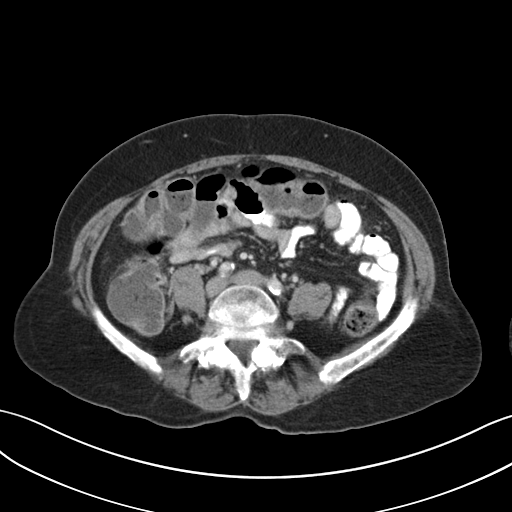
[im 43/85  soft-tissue]
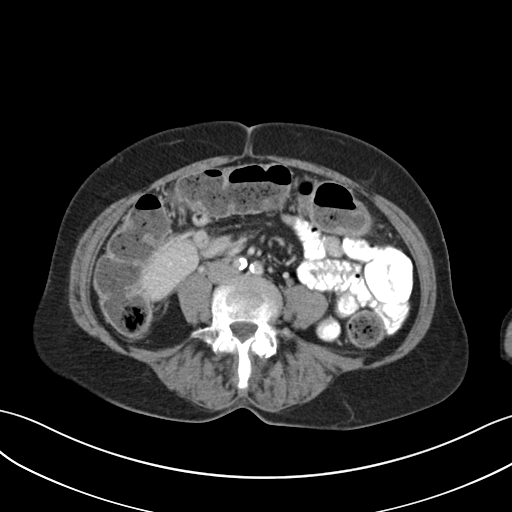
[im 47/85  soft-tissue]
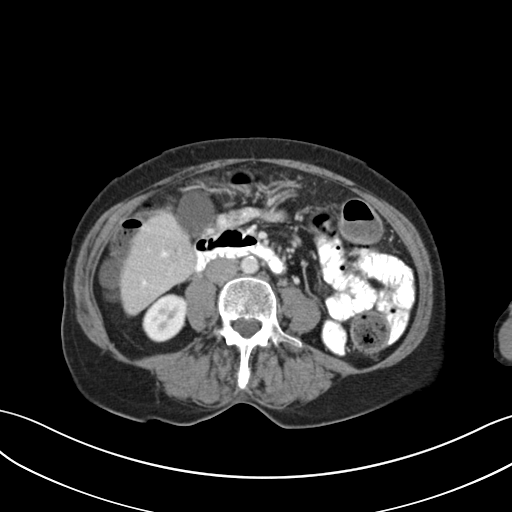
[im 57/85  soft-tissue]
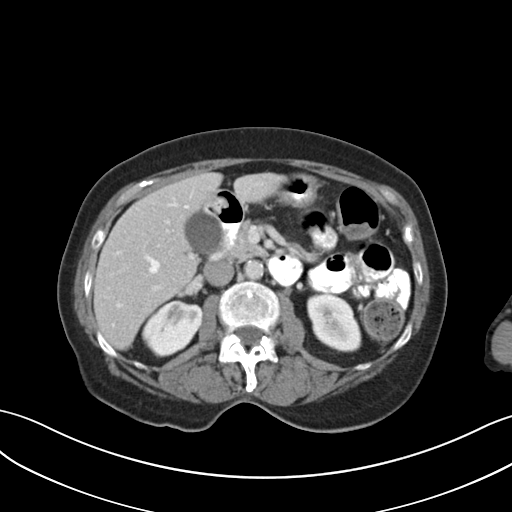
[im 57/85  bone]
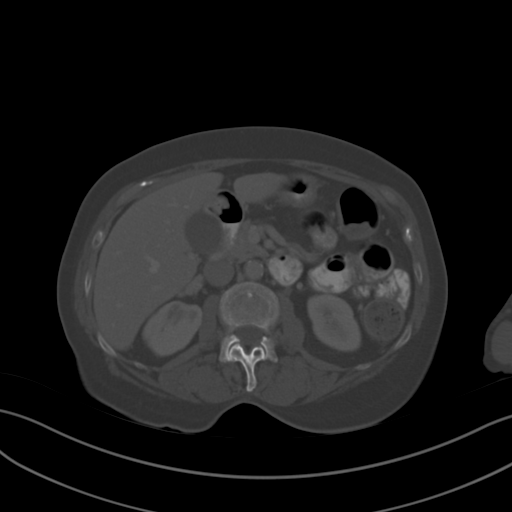
[im 61/85  soft-tissue]
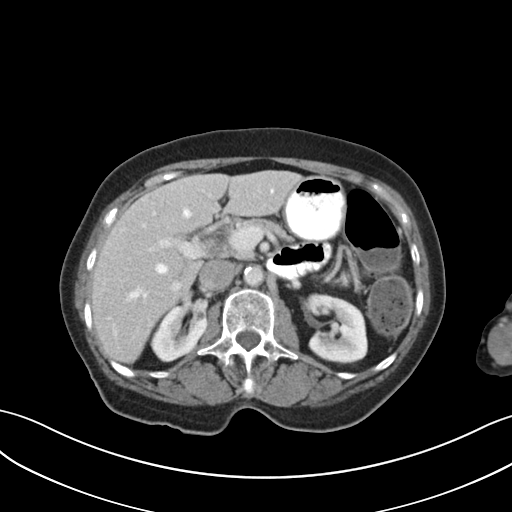
[im 66/85  soft-tissue]
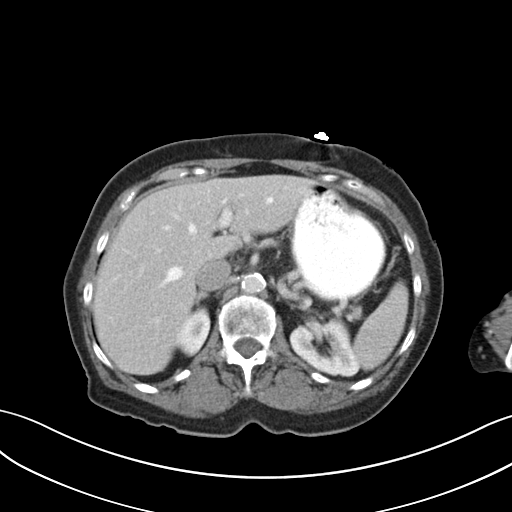
[im 66/85  lung]
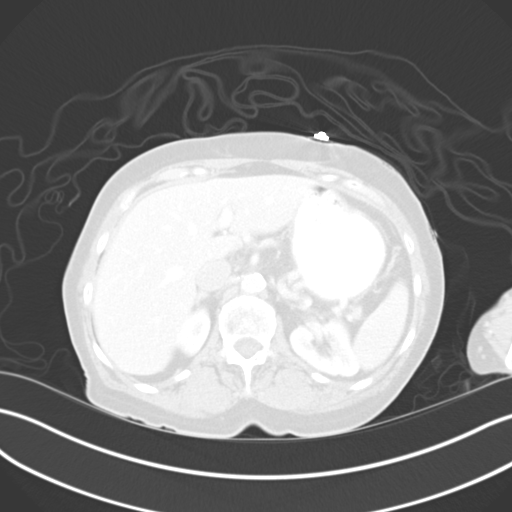
[im 71/85  lung]
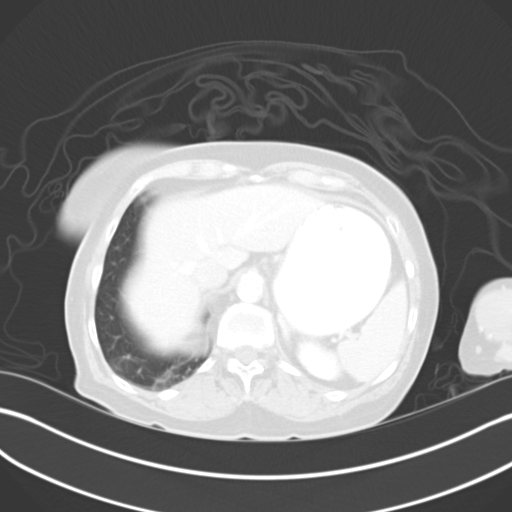
[im 75/85  soft-tissue]
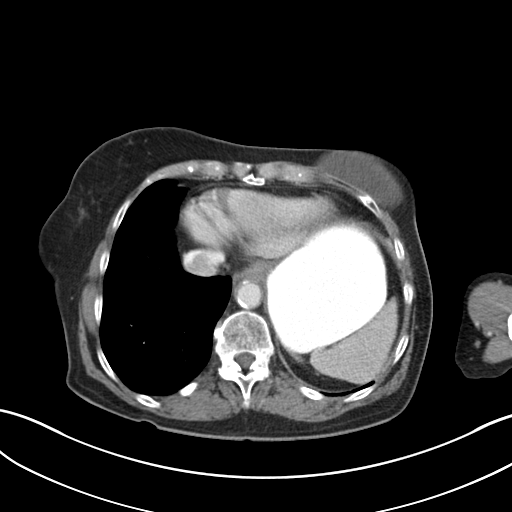
[im 75/85  lung]
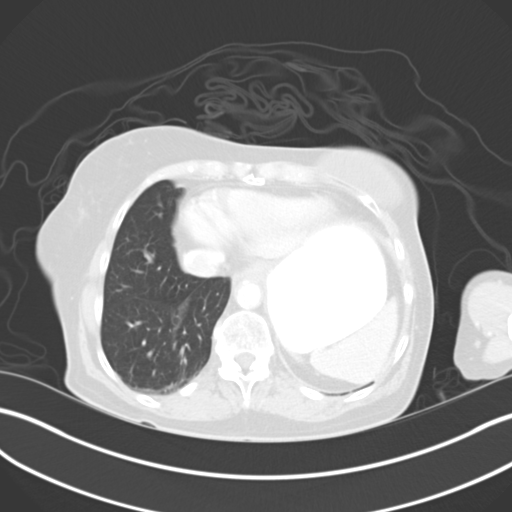
[im 80/85  soft-tissue]
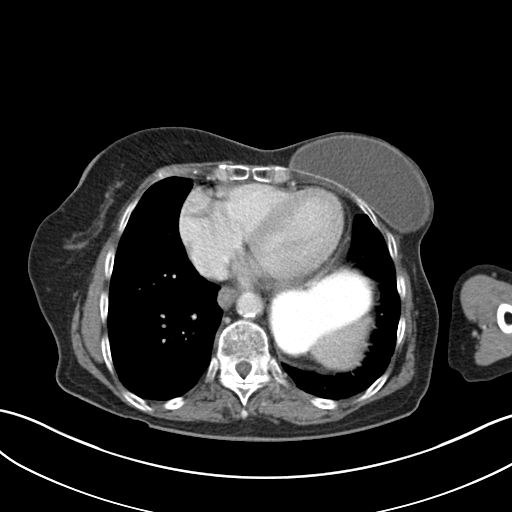
[im 80/85  lung]
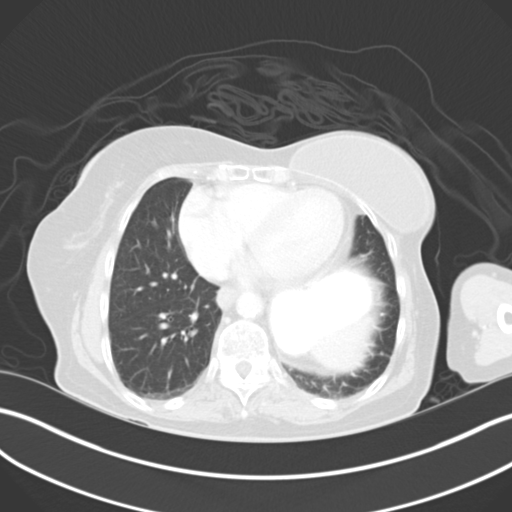

[14 of 32 positions shown; findings below may reference images not displayed]

FINDINGS: Linear subsegmental atelectasis or scarring noted in the
left lower lobe.  Inferior margin of a left breast implant noted.

Elevated left hemidiaphragm noted. The liver, spleen, pancreas, and
adrenal glands appear unremarkable.

The gallbladder and biliary system appear unremarkable.

Wall thickening in the stomach antrum is likely due to peristalsis.

There is at least partial duplication of the right collecting
system, without scarring or hydronephrosis.  Left kidney appears
unremarkable.

Aortoiliac atherosclerotic calcification noted. No pathologic
retroperitoneal or porta hepatis adenopathy is identified.

No pathologic pelvic adenopathy is identified.

Urinary bladder appears unremarkable.  Uterus is absent.

Scattered small diverticula of the ascending colon are identified
along with scattered sigmoid colon diverticula, but without
compelling findings of acute diverticulitis.  No dilated small
bowel is observed.  The appendix is not well seen.

Facet arthropathy is present at L4-5 and L5-S1, with grade 1
degenerative anterolisthesis of L4 on L5.  No free pelvic fluid is
observed.
IMPRESSION: 1.  Scattered colonic diverticula, but without compelling findings
of acute diverticulitis.
2.  Lower lumbar spondylosis.
3.  Partially duplicated right collecting system.
4.  Atherosclerosis.
5.  Prior hysterectomy.
6.  Elevated left hemidiaphragm

## 2014-06-12 ENCOUNTER — Ambulatory Visit: Payer: Self-pay | Admitting: Emergency Medicine

## 2014-07-20 ENCOUNTER — Telehealth: Payer: Self-pay

## 2014-07-20 NOTE — Telephone Encounter (Signed)
PATIENT'S DAUGHTER ([name]) WOULD LIKE DR. DAUB TO  KNOW IT IS TIME FOR HIM TO WRITE HER PRESCRIPTION FOR HYDROCODONE FOR HER NECK PAIN. PLEASE CALL [name] WHEN IT IS READY TO BE PICKED UP. BEST PHONE [phone] (DAUGHTER'S CELL - [name] VERNON)  [place]

## 2014-07-24 ENCOUNTER — Telehealth: Payer: Self-pay | Admitting: Family Medicine

## 2014-07-24 MED ORDER — HYDROCODONE-ACETAMINOPHEN 5-325 MG PO TABS: ORAL_TABLET | ORAL | Status: DC

## 2014-07-24 NOTE — Telephone Encounter (Signed)
Daughter calling back to check on status of request for Hydrocodone.   Call mitzi  [phone]

## 2014-07-24 NOTE — Telephone Encounter (Signed)
Rx at 104

## 2014-07-24 NOTE — Telephone Encounter (Signed)
Called to let pts mother know Rx is ready for pick. She will pick up at 102.

## 2014-07-24 NOTE — Telephone Encounter (Signed)
Called [name], and she reported that she had just gotten a call from [facility] at [address], and when she told Sharyn Lull she couldn't p/up until after 5 pm, Sharyn Lull advised she would bring it to 102 to leave for p/up.

## 2014-09-13 ENCOUNTER — Telehealth: Payer: Self-pay | Admitting: Family Medicine

## 2014-09-13 NOTE — Telephone Encounter (Signed)
Pts daughter called inquiring about refills on oxycodone. Pt was last seen 11/2013. Informed daughter that i would send you a message but didn't think she could get RF until OV. Please advise

## 2014-09-14 ENCOUNTER — Other Ambulatory Visit: Payer: Self-pay | Admitting: Emergency Medicine

## 2014-09-14 MED ORDER — HYDROCODONE-ACETAMINOPHEN 5-325 MG PO TABS: ORAL_TABLET | ORAL | Status: DC

## 2014-09-14 NOTE — Telephone Encounter (Signed)
Spoke with daughter, advised Rx ready to pick up.

## 2014-09-14 NOTE — Telephone Encounter (Signed)
She can pick up the prescription at 104

## 2014-11-18 ENCOUNTER — Other Ambulatory Visit: Payer: Self-pay | Admitting: Emergency Medicine

## 2014-11-19 NOTE — Telephone Encounter (Signed)
Pt hasn't been seen since last June and don't see an appt scheduled.

## 2014-11-20 NOTE — Telephone Encounter (Signed)
Called in.

## 2014-11-22 ENCOUNTER — Telehealth: Payer: Self-pay

## 2014-11-22 NOTE — Telephone Encounter (Signed)
pts daughter Rae Halsted has called on behalf of her mother requesting a refill for hydrocodone Please call [name] at [phone]

## 2014-11-23 ENCOUNTER — Other Ambulatory Visit: Payer: Self-pay | Admitting: Emergency Medicine

## 2014-11-23 DIAGNOSIS — M542 Cervicalgia: Secondary | ICD-10-CM

## 2014-11-23 MED ORDER — HYDROCODONE-ACETAMINOPHEN 5-325 MG PO TABS: ORAL_TABLET | ORAL | Status: DC

## 2015-01-22 ENCOUNTER — Other Ambulatory Visit: Payer: Self-pay | Admitting: Emergency Medicine

## 2015-01-23 NOTE — Telephone Encounter (Signed)
Called daughter to see when pt was going to follow up. Left message for pt to call back.

## 2015-03-12 ENCOUNTER — Encounter: Payer: Self-pay | Admitting: Emergency Medicine

## 2015-03-19 ENCOUNTER — Telehealth: Payer: Self-pay

## 2015-03-19 NOTE — Telephone Encounter (Signed)
Pt needs  A refill on pain medication please call when ready to pick up

## 2015-03-20 ENCOUNTER — Other Ambulatory Visit: Payer: Self-pay | Admitting: Emergency Medicine

## 2015-03-20 DIAGNOSIS — M542 Cervicalgia: Secondary | ICD-10-CM

## 2015-03-20 MED ORDER — HYDROCODONE-ACETAMINOPHEN 5-325 MG PO TABS: ORAL_TABLET | ORAL | Status: DC

## 2015-03-20 NOTE — Telephone Encounter (Signed)
Notified pt ready for p/up. Pt's emerg contact [name] will p/up.

## 2015-04-16 ENCOUNTER — Encounter: Payer: Self-pay | Admitting: Emergency Medicine

## 2015-04-16 ENCOUNTER — Telehealth: Payer: Self-pay | Admitting: Emergency Medicine

## 2015-04-16 ENCOUNTER — Ambulatory Visit (INDEPENDENT_AMBULATORY_CARE_PROVIDER_SITE_OTHER): Payer: Medicare Other | Admitting: Emergency Medicine

## 2015-04-16 VITALS — BP 185/77 | HR 88 | Temp 98.2°F | Resp 16 | Ht 62.0 in | Wt 120.0 lb

## 2015-04-16 DIAGNOSIS — Z853 Personal history of malignant neoplasm of breast: Secondary | ICD-10-CM

## 2015-04-16 DIAGNOSIS — Z23 Encounter for immunization: Secondary | ICD-10-CM | POA: Diagnosis not present

## 2015-04-16 DIAGNOSIS — M542 Cervicalgia: Secondary | ICD-10-CM

## 2015-04-16 DIAGNOSIS — E038 Other specified hypothyroidism: Secondary | ICD-10-CM | POA: Diagnosis not present

## 2015-04-16 DIAGNOSIS — F03918 Unspecified dementia, unspecified severity, with other behavioral disturbance: Secondary | ICD-10-CM

## 2015-04-16 DIAGNOSIS — F0391 Unspecified dementia with behavioral disturbance: Secondary | ICD-10-CM | POA: Diagnosis not present

## 2015-04-16 DIAGNOSIS — Z111 Encounter for screening for respiratory tuberculosis: Secondary | ICD-10-CM

## 2015-04-16 DIAGNOSIS — I10 Essential (primary) hypertension: Secondary | ICD-10-CM | POA: Diagnosis not present

## 2015-04-16 LAB — CBC WITH DIFFERENTIAL/PLATELET
Basophils Absolute: 0 10*3/uL (ref 0.0–0.1)
Basophils Relative: 0 % (ref 0–1)
EOS ABS: 0 10*3/uL (ref 0.0–0.7)
Eosinophils Relative: 0 % (ref 0–5)
HCT: 42.7 % (ref 36.0–46.0)
HEMOGLOBIN: 14.8 g/dL (ref 12.0–15.0)
LYMPHS ABS: 2.1 10*3/uL (ref 0.7–4.0)
LYMPHS PCT: 24 % (ref 12–46)
MCH: 29.3 pg (ref 26.0–34.0)
MCHC: 34.7 g/dL (ref 30.0–36.0)
MCV: 84.6 fL (ref 78.0–100.0)
MONOS PCT: 6 % (ref 3–12)
MPV: 9.8 fL (ref 8.6–12.4)
Monocytes Absolute: 0.5 10*3/uL (ref 0.1–1.0)
NEUTROS ABS: 6.2 10*3/uL (ref 1.7–7.7)
Neutrophils Relative %: 70 % (ref 43–77)
Platelets: 242 10*3/uL (ref 150–400)
RBC: 5.05 MIL/uL (ref 3.87–5.11)
RDW: 14.9 % (ref 11.5–15.5)
WBC: 8.9 10*3/uL (ref 4.0–10.5)

## 2015-04-16 LAB — BASIC METABOLIC PANEL WITH GFR
BUN: 10 mg/dL (ref 7–25)
CO2: 28 mmol/L (ref 20–31)
Calcium: 10.2 mg/dL (ref 8.6–10.4)
Chloride: 110 mmol/L (ref 98–110)
Creat: 0.72 mg/dL (ref 0.60–0.88)
GFR, EST NON AFRICAN AMERICAN: 79 mL/min (ref >=60)
GFR, Est African American: >89 mL/min (ref >=60)
GLUCOSE: 99 mg/dL (ref 65–99)
Potassium: 5.3 mmol/L (ref 3.5–5.3)
SODIUM: 151 mmol/L — AB (ref 135–146)

## 2015-04-16 MED ORDER — RAMIPRIL 10 MG PO CAPS: ORAL_CAPSULE | ORAL | Status: AC

## 2015-04-16 MED ORDER — HYDROCODONE-ACETAMINOPHEN 5-325 MG PO TABS: ORAL_TABLET | ORAL | Status: DC

## 2015-04-16 MED ORDER — LORAZEPAM 0.5 MG PO TABS: ORAL_TABLET | ORAL | Status: DC

## 2015-04-16 NOTE — Progress Notes (Signed)
Patient ID: Sheri Shepherd, female   DOB: 05-06-1933, 81 y.o.   MRN: [id number]     This chart was scribed for Arlyss Queen, MD by Zola Button, Medical Scribe. This patient was seen in room 21 and the patient's care was started at 11:22 AM.   Chief Complaint:  Chief Complaint  Patient presents with  . Follow-up  . Thyroid Problem  . PPD Placement    for assisted living    HPI: Sheri Shepherd is a 81 y.o. female who reports to [facility] today for a follow-up. Patient has been doing all right overall. She has been able to move her neck fine. She denies recent falls. She had cataracts surgery 7-8 years ago. Patient reluctantly agrees to have the flu shot.  Past Medical History  Diagnosis Date  . Back pain   . History of colonic polyps   . Hx of thyroid nodule   . PONV (postoperative nausea and vomiting)     Pt is "groggy" a while after surgery   . Memory loss   . Bronchitis     hx of  . Vertigo   . Hearing loss     bilateral ear  . Diverticulosis   . Frequency of urination   . Bruises easily   . Cancer ([place])     left Breast   . Hypertension   . Hypothyroid   . Anxiety    Past Surgical History  Procedure Laterality Date  . Cervical fusion      x2  . Thyroidectomy, partial    . Abdominal hysterectomy  1978  . Colonoscopy    . Cataract extraction, bilateral    . Appendectomy    . Eye surgery    . Mastectomy  1999    Left  . Tonsillectomy    . Posterior cervical fusion/foraminotomy  02/18/2012    Procedure: POSTERIOR CERVICAL FUSION/FORAMINOTOMY LEVEL 4;  Surgeon: Erline Levine, MD;  Location: [facility];  Service: Neurosurgery;  Laterality: N/A;  Cervical four - seven  Posterior cervical decompression/fusion   Social History   Social History  . Marital Status: Divorced    Spouse Name: N/A  . Number of Children: N/A  . Years of Education: N/A   Social History Main Topics  . Smoking status: Never Smoker   . Smokeless tobacco: Never Used  . Alcohol Use: No    . Drug Use: No  . Sexual Activity: No   Other Topics Concern  . None   Social History Narrative   No family history on file. Allergies  Allergen Reactions  . Lyrica [Pregabalin]   . Penicillins Other (See Comments)    Heart palpitations  . Prednisone     Elevated heartrate.  . Tramadol Other (See Comments)    Severe mood swings and confusion  . Sulfa Antibiotics Rash    Red, swollen areas as well.Current Outpatient Prescriptions on File Prior to Visit: levothyroxine (SYNTHROID, LEVOTHROID) 75 MCG tablet, Take 1 tablet (75 mcg total) by mouth daily. Needs office visit with labs., Disp: 30 tablet, Rfl: 3 Red swollen areas as well.     Prior to Admission medications   Medication Sig Start Date End Date Taking? Authorizing Provider  aspirin 325 MG tablet Take 325 mg by mouth daily as needed. For pain   Yes Historical Provider, MD  HYDROcodone-acetaminophen (NORCO/VICODIN) 5-325 MG tablet One half to one tablet every 8-12 hours as needed for pain 03/20/15  Yes Darlyne Russian, MD  LORazepam (ATIVAN)  0.5 MG tablet TAKE 1/2 TABLET BY MOUTH AT NIGHT IF NECESSARY 11/19/14  Yes Darlyne Russian, MD  Multiple Vitamin (MULTIVITAMIN WITH MINERALS) TABS Take 1 tablet by mouth daily.   Yes Historical Provider, MD  ramipril (ALTACE) 5 MG capsule TAKE 1 CAPSULE BY MOUTH EVERY DAY 12/14/13  Yes Darlyne Russian, MD  SYNTHROID 88 MCG tablet TAKE 1 TABLET BY MOUTH EVERY DAY A BREAKFAST 01/24/15  Yes Darlyne Russian, MD     ROS: The patient denies fevers, chills, night sweats, unintentional weight loss, chest pain, palpitations, wheezing, dyspnea on exertion, dysuria, hematuria, melena, numbness, weakness, or tingling.   All other systems have been reviewed and were otherwise negative with the exception of those mentioned in the HPI and as above.    PHYSICAL EXAM: Filed Vitals:   04/16/15 1058  BP: 185/77  Pulse: 88  Temp: 98.2 F (36.8 C)  Resp: 16   Body mass index is 21.94 kg/(m^2).   General:  Alert, no acute distress, cooperative. Somewhat disheveled. HEENT:  Normocephalic, atraumatic, oropharynx patent. Eye: Sheri Shepherd [facility] Cardiovascular:  Regular rate and rhythm, grade 2 systolic murmur, left upper sternal border. Respiratory: Clear to auscultation bilaterally.  No wheezes, rales, or rhonchi.  No cyanosis, no use of accessory musculature Abdominal: No organomegaly, abdomen is soft and non-tender, positive bowel sounds.  No masses. Musculoskeletal: Gait intact. No edema, tenderness Skin: No rashes. Neurologic: Facial musculature symmetric. Patient is extremely agitated. She had no focal neurological signs. She walks with the assistance of a walker with a very slow shuffling gait. She is not stable without her walker. Psychiatric: Patient acts appropriately throughout our interaction. Lymphatic: No cervical or submandibular lymphadenopathy     LABS:    EKG/XRAY:   Primary read interpreted by Dr. Everlene Farrier at [facility].   ASSESSMENT/PLAN: Blood pressure not under control. Patient became extremely agitated regarding the need for her to go to assisted living. She walks with the assistance of her walker. She has a very slow shuffling gait. She has not stable with ambulation. She cannot continue at home. She refused a flu shot. She did allow [place] to place a PPD. I did increase her Altace to 10 mg a day. She will have her PPD read in 48 hours. Recheck in 1-2 weeks. [place] 2 form was completed.  By signing my name below, I, Zola Button, attest that this documentation has been prepared under the direction and in the presence of Arlyss Queen, MD.  Electronically Signed: Zola Button, Medical Scribe. 04/16/2015. 11:22 AM.   Gross sideeffects, risk and benefits, and alternatives of medications d/w patient. Patient is aware that all medications have potential sideeffects and we are unable to predict every sideeffect or drug-drug interaction that may occur.  Arlyss Queen MD 04/16/2015 11:22 AM

## 2015-04-16 NOTE — Progress Notes (Signed)
   Subjective:    Patient ID: Sheri Shepherd, female    DOB: 16-Aug-1932, 81 y.o.   MRN: [id number]  HPI    Review of Systems     Objective:   Physical Exam        Assessment & Plan:    Tuberculosis Risk Questionnaire  1. No Were you born outside the [place] in one of the following parts of the world: [place], [place], [place], [place] or [place]?    2. No Have you traveled outside the [place] and lived for more than one month in one of the following parts of the world: [place], [place], [place], [place] or [place]?    3. No Do you have a compromised immune system such as from any of the following conditions:HIV/AIDS, organ or bone marrow transplantation, diabetes, immunosuppressive medicines (e.g. Prednisone, Remicaide), leukemia, lymphoma, cancer of the head or neck, gastrectomy or jejunal bypass, end-stage renal disease (on dialysis), or silicosis?     4. Yes  Have you ever or do you plan on working in: a residential care center, a health care facility, a jail or prison or homeless shelter?    5. No Have you ever: injected illegal drugs, used crack cocaine, lived in a homeless shelter  or been in jail or prison?     6. No Have you ever been exposed to anyone with infectious tuberculosis?    Tuberculosis Symptom Questionnaire  Do you currently have any of the following symptoms?  1. No Unexplained cough lasting more than 3 weeks?   2. No Unexplained fever lasting more than 3 weeks.   3. No Night Sweats (sweating that leaves the bedclothes and sheets wet)     4. No Shortness of Breath   5. No Chest Pain   6. No Unintentional weight loss    7. No Unexplained fatigue (very tired for no reason)

## 2015-04-17 LAB — T4, FREE: Free T4: 0.62 ng/dL — ABNORMAL LOW (ref 0.80–1.80)

## 2015-04-17 LAB — TSH: TSH: 31.675 u[IU]/mL — AB (ref 0.350–4.500)

## 2015-04-17 NOTE — Progress Notes (Signed)
   Subjective:    Patient ID: Sheri Shepherd, female    DOB: 12-19-1932, 81 y.o.   MRN: [id number]  HPI    Review of Systems     Objective:   Physical Exam        Assessment & Plan:

## 2015-04-18 ENCOUNTER — Encounter: Payer: Medicare Other | Admitting: *Deleted

## 2015-04-18 DIAGNOSIS — Z111 Encounter for screening for respiratory tuberculosis: Secondary | ICD-10-CM

## 2015-04-18 LAB — TB SKIN TEST
Induration: 0 mm
TB SKIN TEST: NEGATIVE

## 2015-04-23 ENCOUNTER — Ambulatory Visit: Payer: Medicare Other | Admitting: Emergency Medicine

## 2015-04-25 ENCOUNTER — Other Ambulatory Visit: Payer: Self-pay | Admitting: Emergency Medicine

## 2015-04-25 ENCOUNTER — Telehealth: Payer: Self-pay

## 2015-04-25 ENCOUNTER — Ambulatory Visit: Payer: Medicare Other | Admitting: Emergency Medicine

## 2015-04-25 DIAGNOSIS — F03918 Unspecified dementia, unspecified severity, with other behavioral disturbance: Secondary | ICD-10-CM

## 2015-04-25 DIAGNOSIS — F0391 Unspecified dementia with behavioral disturbance: Secondary | ICD-10-CM

## 2015-04-25 MED ORDER — LORAZEPAM 0.5 MG PO TABS: ORAL_TABLET | ORAL | Status: AC

## 2015-04-25 NOTE — Telephone Encounter (Signed)
Dr. Everlene Farrier, [place] [facility] need pt's Ativan rewritten with a specific sig. Pt's ins will not fill it with "at night as needed". Thanks

## 2015-04-25 NOTE — Telephone Encounter (Signed)
Daughter [name] [phone] Marry Guan at [facility] [phone] house in [place] please fax orders to [phone] about Ativan please specify within how many hours this can be administered within specified length of time, ie) 1.Marland [place] 4-6 hours...  Please contact [name] (POA) when complete

## 2015-04-26 NOTE — Telephone Encounter (Signed)
Faxed and notified daughter on VM.

## 2015-05-06 ENCOUNTER — Telehealth: Payer: Self-pay

## 2015-05-06 NOTE — Telephone Encounter (Signed)
Sheri Shepherd from [facility] [phone]) called to let Dr. Everlene Farrier know that she will be seeing pt for nursing services, PT, and OT. 2x a week for 3 weeks.

## 2015-05-07 NOTE — Telephone Encounter (Signed)
That is great news

## 2015-07-18 ENCOUNTER — Other Ambulatory Visit: Payer: Self-pay

## 2015-07-18 DIAGNOSIS — M542 Cervicalgia: Secondary | ICD-10-CM

## 2015-07-18 NOTE — Telephone Encounter (Signed)
Pt's daughter is calling to request a refill of  HYDROcodone-acetaminophen (NORCO/VICODIN) 5-325 MG tablet for Mrs. Coston. Please call when ready for pickup.

## 2015-07-24 MED ORDER — HYDROCODONE-ACETAMINOPHEN 5-325 MG PO TABS: ORAL_TABLET | ORAL | Status: DC

## 2015-07-24 NOTE — Telephone Encounter (Signed)
Notified daughter ready. 

## 2015-09-25 ENCOUNTER — Telehealth: Payer: Self-pay

## 2015-09-25 NOTE — Telephone Encounter (Signed)
Patient's daughter Lorita Officer is calling to request a refill for hydrocodone.

## 2015-09-26 ENCOUNTER — Other Ambulatory Visit: Payer: Self-pay | Admitting: Emergency Medicine

## 2015-09-26 DIAGNOSIS — M542 Cervicalgia: Secondary | ICD-10-CM

## 2015-09-26 MED ORDER — HYDROCODONE-ACETAMINOPHEN 5-325 MG PO TABS: ORAL_TABLET | ORAL | Status: DC

## 2015-09-27 NOTE — Telephone Encounter (Signed)
Notified daughter ready. 

## 2015-11-29 ENCOUNTER — Telehealth: Payer: Self-pay

## 2015-11-29 NOTE — Telephone Encounter (Signed)
Message routed to Dr. Everlene Farrier

## 2015-11-29 NOTE — Telephone Encounter (Signed)
Pt is needing a refill on her hydrocodone  Best number [phone]

## 2015-11-30 ENCOUNTER — Other Ambulatory Visit: Payer: Self-pay | Admitting: Emergency Medicine

## 2015-11-30 DIAGNOSIS — M542 Cervicalgia: Secondary | ICD-10-CM

## 2015-11-30 MED ORDER — HYDROCODONE-ACETAMINOPHEN 5-325 MG PO TABS: ORAL_TABLET | ORAL | Status: DC

## 2015-12-03 ENCOUNTER — Other Ambulatory Visit: Payer: Self-pay

## 2015-12-03 NOTE — Telephone Encounter (Signed)
RX placed at front desk for pt pick up. Pt called to advise - spoke with dtr [name] ([place])

## 2016-01-24 ENCOUNTER — Other Ambulatory Visit: Payer: Self-pay

## 2016-01-24 DIAGNOSIS — M542 Cervicalgia: Secondary | ICD-10-CM

## 2016-01-24 NOTE — Telephone Encounter (Signed)
The patient's daughter and POA, [name], called to request a refill of the patient's prescription for HYDROcodone-acetaminophen (NORCO/VICODIN) 5-325 MG tablet.  This medication is typically refilled every few months by Dr Everlene Farrier.  Please advise, thank you.  CB#: [phone]

## 2016-01-28 MED ORDER — HYDROCODONE-ACETAMINOPHEN 5-325 MG PO TABS: ORAL_TABLET | ORAL | 0 refills | Status: AC

## 2016-01-28 NOTE — Telephone Encounter (Addendum)
Notified daughter ready. Explained same day appts.

## 2016-01-28 NOTE — Telephone Encounter (Signed)
Pt last seen in 04/2015. Pended for review.

## 2016-03-04 ENCOUNTER — Other Ambulatory Visit: Payer: Self-pay

## 2016-03-04 DIAGNOSIS — M542 Cervicalgia: Secondary | ICD-10-CM

## 2016-03-04 NOTE — Telephone Encounter (Signed)
PATIENT'S DAUGHTER ([name] VERNON) WOULD LIKE TO KNOW IF ANOTHER DOCTOR WILL REFILL HER MOTHER'S HYDROCODONE 5-325 MG FOR PAIN FROM HER NECK FUSION. SHE TAKES IT AT NIGHT TO HELP HER SLEEP. DR. Everlene Farrier HAS BEEN HER PCP. BEST PHONE [phone] (DAUGHTER'S CELL) PLEASE CALL HER WHEN SHE CAN PICK THE PRESCRIPTION UP.  [place]

## 2016-03-09 NOTE — Telephone Encounter (Signed)
Pt was last seen last Nov, Dr Everlene Farrier last [place] in June. Do you want to give pt any to give her time to come in, or just deny?

## 2016-05-21 ENCOUNTER — Encounter (HOSPITAL_COMMUNITY): Payer: Self-pay | Admitting: Emergency Medicine

## 2016-05-21 ENCOUNTER — Emergency Department (HOSPITAL_COMMUNITY)
Admission: EM | Admit: 2016-05-21 | Discharge: 2016-05-21 | Disposition: A | Discharge status: Home / Self Care | Payer: Medicare Other | Attending: Emergency Medicine | Admitting: Emergency Medicine

## 2016-05-21 DIAGNOSIS — E039 Hypothyroidism, unspecified: Secondary | ICD-10-CM | POA: Insufficient documentation

## 2016-05-21 DIAGNOSIS — Z79899 Other long term (current) drug therapy: Secondary | ICD-10-CM | POA: Diagnosis not present

## 2016-05-21 DIAGNOSIS — I10 Essential (primary) hypertension: Secondary | ICD-10-CM | POA: Diagnosis not present

## 2016-05-21 DIAGNOSIS — R531 Weakness: Secondary | ICD-10-CM | POA: Diagnosis present

## 2016-05-21 DIAGNOSIS — Z853 Personal history of malignant neoplasm of breast: Secondary | ICD-10-CM | POA: Insufficient documentation

## 2016-05-21 DIAGNOSIS — R110 Nausea: Secondary | ICD-10-CM | POA: Diagnosis not present

## 2016-05-21 DIAGNOSIS — N390 Urinary tract infection, site not specified: Secondary | ICD-10-CM | POA: Diagnosis not present

## 2016-05-21 DIAGNOSIS — Z7982 Long term (current) use of aspirin: Secondary | ICD-10-CM | POA: Diagnosis not present

## 2016-05-21 LAB — CBC
HEMATOCRIT: 43.4 % (ref 36.0–46.0)
Hemoglobin: 14.1 g/dL (ref 12.0–15.0)
MCH: 27.4 pg (ref 26.0–34.0)
MCHC: 32.5 g/dL (ref 30.0–36.0)
MCV: 84.3 fL (ref 78.0–100.0)
PLATELETS: 215 10*3/uL (ref 150–400)
RBC: 5.15 MIL/uL — ABNORMAL HIGH (ref 3.87–5.11)
RDW: 15.8 % — AB (ref 11.5–15.5)
WBC: 8.6 10*3/uL (ref 4.0–10.5)

## 2016-05-21 LAB — URINALYSIS, ROUTINE W REFLEX MICROSCOPIC
BILIRUBIN URINE: NEGATIVE
Glucose, UA: NEGATIVE mg/dL
KETONES UR: NEGATIVE mg/dL
NITRITE: NEGATIVE
PROTEIN: NEGATIVE mg/dL
Specific Gravity, Urine: 1.025 (ref 1.005–1.030)
pH: 5.5 (ref 5.0–8.0)

## 2016-05-21 LAB — LIPASE, BLOOD: Lipase: 26 U/L (ref 11–51)

## 2016-05-21 LAB — COMPREHENSIVE METABOLIC PANEL
ALK PHOS: 136 U/L — AB (ref 38–126)
ALT: 12 U/L — ABNORMAL LOW (ref 14–54)
AST: 27 U/L (ref 15–41)
Albumin: 3.6 g/dL (ref 3.5–5.0)
Anion gap: 10 (ref 5–15)
BILIRUBIN TOTAL: 0.6 mg/dL (ref 0.3–1.2)
BUN: 9 mg/dL (ref 6–20)
CALCIUM: 9.6 mg/dL (ref 8.9–10.3)
CO2: 26 mmol/L (ref 22–32)
CREATININE: 1.05 mg/dL — AB (ref 0.44–1.00)
Chloride: 103 mmol/L (ref 101–111)
GFR calc Af Amer: 56 mL/min — ABNORMAL LOW (ref >60)
GFR, EST NON AFRICAN AMERICAN: 48 mL/min — AB (ref >60)
Glucose, Bld: 160 mg/dL — ABNORMAL HIGH (ref 65–99)
POTASSIUM: 3.8 mmol/L (ref 3.5–5.1)
Sodium: 139 mmol/L (ref 135–145)
TOTAL PROTEIN: 7.1 g/dL (ref 6.5–8.1)

## 2016-05-21 LAB — URINALYSIS, MICROSCOPIC (REFLEX)

## 2016-05-21 LAB — I-STAT TROPONIN, ED: TROPONIN I, POC: 0.01 ng/mL (ref 0.00–0.08)

## 2016-05-21 MED ORDER — RANITIDINE HCL 150 MG PO CAPS: 150.0000 mg | ORAL_CAPSULE | Freq: Two times a day (BID) | ORAL | 0 refills | Status: AC | PRN

## 2016-05-21 MED ORDER — SODIUM CHLORIDE 0.9 % IV BOLUS (SEPSIS)
1000.0000 mL | Freq: Once | INTRAVENOUS | Status: AC
  Administered 2016-05-21: 1000 mL via INTRAVENOUS

## 2016-05-21 NOTE — ED Provider Notes (Signed)
[facility] DEPT Provider Note   CSN: [id number] Arrival date & time: 05/21/16  1433     History   Chief Complaint Chief Complaint  Patient presents with  . Nausea  . Dizziness    HPI Sheri Shepherd is a 82 y.o. female.  HPI  82 year old female with history of diverticulosis, hypertension, anxiety, thyroid disease, vertigo, memory loss, cancer brought here via EMS from [facility] for evaluation of dizziness. History obtained through daughter who is at bedside. Per daughter patient were noted to have some generalized weakness, and was complaining of nausea and indigestion early in the day. This is after she went to use the bathroom and was returning to bed. She stays in bed longer than usual. Daughter mentions since patient had been staying at the nursing facility and having to wear adult diaper, she has had recurrent urinary tract infection. She is currently being treated with a urinary tract infection since December 5, currently on nitrofurantoin. Patient does complain of some mild urinary discomfort but states that it is getting better.. Currently patient has no specific complaint. She reports feeling better after she ate some food. Daughter noticed that patient is back to her baseline. Daughter does voice concern of patient's indigestion. There is no report of room spinning sensation or lightheadedness from patient  Past Medical History:  Diagnosis Date  . Anxiety   . Back pain   . Bronchitis    hx of  . Bruises easily   . Cancer ([place])    left Breast   . Diverticulosis   . Frequency of urination   . Hearing loss    bilateral ear  . History of colonic polyps   . Hx of thyroid nodule   . Hypertension   . Hypothyroid   . Memory loss   . PONV (postoperative nausea and vomiting)    Pt is "groggy" a while after surgery   . Vertigo     Patient Active Problem List   Diagnosis Date Noted  . Breast cancer ([place]) 10/03/2012  . Abdominal pain 01/14/2012  . Nausea &  vomiting 01/14/2012  . HTN (hypertension) 01/14/2012  . Personal history of colonic polyps 01/14/2012  . Diverticulosis 01/14/2012  . Neck pain 01/14/2012    Past Surgical History:  Procedure Laterality Date  . ABDOMINAL HYSTERECTOMY  1978  . APPENDECTOMY    . CATARACT EXTRACTION, BILATERAL    . CERVICAL FUSION     x2  . colonoscopy    . EYE SURGERY    . MASTECTOMY  1999   Left  . POSTERIOR CERVICAL FUSION/FORAMINOTOMY  02/18/2012   Procedure: POSTERIOR CERVICAL FUSION/FORAMINOTOMY LEVEL 4;  Surgeon: Erline Levine, MD;  Location: [facility];  Service: Neurosurgery;  Laterality: N/A;  Cervical four - seven  Posterior cervical decompression/fusion  . THYROIDECTOMY, PARTIAL    . TONSILLECTOMY      OB History    No data available       Home Medications    Prior to Admission medications   Medication Sig Start Date End Date Taking? Authorizing Provider  aspirin 325 MG tablet Take 325 mg by mouth daily as needed. For pain    Historical Provider, MD  HYDROcodone-acetaminophen (NORCO/VICODIN) 5-325 MG tablet One half to one tablet every 8-12 hours as needed for pain 01/28/16   Darlyne Russian, MD  LORazepam (ATIVAN) 0.5 MG tablet TAKE 1/2 TABLET BY MOUTH AT NIGHT 04/25/15   Darlyne Russian, MD  Multiple Vitamin (MULTIVITAMIN WITH MINERALS) TABS  Take 1 tablet by mouth daily.    Historical Provider, MD  ramipril (ALTACE) 10 MG capsule TAKE 1 CAPSULE BY MOUTH EVERY DAY 04/16/15   Darlyne Russian, MD  SYNTHROID 88 MCG tablet TAKE 1 TABLET BY MOUTH EVERY DAY A BREAKFAST 01/24/15   Darlyne Russian, MD    Family History History reviewed. No pertinent family history.  Social History Social History  Substance Use Topics  . Smoking status: Never Smoker  . Smokeless tobacco: Never Used  . Alcohol use No     Allergies   Lyrica [pregabalin]; Penicillins; Prednisone; Tramadol; and Sulfa antibiotics   Review of Systems Review of Systems  All other systems reviewed and are  negative.    Physical Exam Updated Vital Signs BP 157/73 (BP Location: Right Arm)   Pulse 75   Temp 97.6 F (36.4 C) (Oral)   Resp 16   Ht 5\' 4"  (1.626 m)   SpO2 98%   Physical Exam  Constitutional: She appears well-developed and well-nourished. No distress.  Elderly female, well-appearing, smiling, in no acute discomfort.  HENT:  Head: Atraumatic.  Mouth/Throat: Oropharynx is clear and moist.  Eyes: Conjunctivae are normal.  Neck: Neck supple.  Cardiovascular: Normal rate and regular rhythm.   Pulmonary/Chest: Effort normal and breath sounds normal.  Abdominal: Soft. There is no tenderness.  Neurological: She is alert. She has normal strength. No cranial nerve deficit or sensory deficit. GCS eye subscore is 4. GCS verbal subscore is 5. GCS motor subscore is 6.  Alert to self and place, but not to time  Skin: No rash noted.  Psychiatric: She has a normal mood and affect.  Nursing note and vitals reviewed.    ED Treatments / Results  Labs (all labs ordered are listed, but only abnormal results are displayed) Labs Reviewed  COMPREHENSIVE METABOLIC PANEL - Abnormal; Notable for the following:       Result Value   Glucose, Bld 160 (*)    Creatinine, Ser 1.05 (*)    ALT 12 (*)    Alkaline Phosphatase 136 (*)    GFR calc non Af Amer 48 (*)    GFR calc Af Amer 56 (*)    All other components within normal limits  CBC - Abnormal; Notable for the following:    RBC 5.15 (*)    RDW 15.8 (*)    All other components within normal limits  URINALYSIS, ROUTINE W REFLEX MICROSCOPIC - Abnormal; Notable for the following:    Color, Urine AMBER (*)    APPearance HAZY (*)    Hgb urine dipstick MODERATE (*)    Leukocytes, UA SMALL (*)    All other components within normal limits  URINALYSIS, MICROSCOPIC (REFLEX) - Abnormal; Notable for the following:    Bacteria, UA MANY (*)    Squamous Epithelial / LPF 6-30 (*)    All other components within normal limits  LIPASE, BLOOD     EKG  EKG Interpretation  Date/Time:  Thursday May 21 2016 18:57:48 EST Ventricular Rate:  75 PR Interval:    QRS Duration: 134 QT Interval:  426 QTC Calculation: 476 R Axis:   89 Text Interpretation:  Sinus rhythm Right bundle branch block RBBB new from previous EKG 4 years ago Confirmed by LITTLE MD, RACHEL [id number]) on 05/21/2016 7:07:01 PM       Radiology No results found.  Procedures Procedures (including critical care time)  Medications Ordered in ED Medications - No data to display   Initial Impression /  Assessment and Plan / ED Course  I have reviewed the triage vital signs and the nursing notes.  Pertinent labs & imaging results that were available during my care of the patient were reviewed by me and considered in my medical decision making (see chart for details).  Clinical Course     BP 155/78 (BP Location: Right Arm)   Pulse 89   Temp 97.6 F (36.4 C) (Oral)   Resp 18   Ht 5\' 4"  (1.626 m)   SpO2 99%    Final Clinical Impressions(s) / ED Diagnoses   Final diagnoses:  Nausea  Lower urinary tract infectious disease    New Prescriptions Discharge Medication List as of 05/21/2016  7:52 PM    START taking these medications   Details  ranitidine (ZANTAC) 150 MG capsule Take 1 capsule (150 mg total) by mouth 2 (two) times daily as needed for heartburn., Starting Thu 05/21/2016, Print       6:49 PM Patient with report of indigestion, nausea, and generalized weakness during the day which has since resolved. No report of focal neuro deficit on her mental status. She is currently being treated for urinary tract infection with antibiotic. Patient is well-appearing, vital signs stable, no abdominal discomfort exam. Chest no focal neuro deficit concerning for acute stroke, or TIA. She denies having any nausea or dizziness at this time.  7:08 PM Pt does hve a new RBBB when compared to an EKG 4 years ago. She does not have any active CP and her sxs  does not suggest ACS.  After discussion with Dr. Rex Kras, who have evaluated pt and discussed with family member  Pt signed out to oncoming provider for further management.    Domenic Moras, PA-C 05/22/16 [place], MD 05/22/16 [phone]

## 2016-05-21 NOTE — Discharge Instructions (Signed)
Please take Zantac as needed for indigestion.  Continue taking nitrofurantoin as treatment for your urinary tract infection.  If your urine culture indicate that you may need a different antibiotic, we will contact you and prescribe the appropriate antibiotic.  Follow up with your doctor for further care.  Please stay hydrated and drink plenty of fluid at home.

## 2016-05-21 NOTE — ED Notes (Signed)
Pt resting quietly at the time. Daughter at bedside. Vital signs stable.

## 2016-05-21 NOTE — ED Notes (Signed)
Pt confused per neuro baseline, history of dementia. Daughter at bedside. Vital signs stable. Plan of care discussed with daughter and pt.

## 2016-05-21 NOTE — ED Provider Notes (Signed)
8:55 PM Patient care assumed from Domenic Moras, PA-C at change of shift. Patient diagnosed with a urinary tract infection today. Troponin pending at shift change. This has been reviewed and is negative at 0.01. The patient has been evaluated by myself. She is alert and pleasant and in no distress. Nontoxic appearing with stable vital signs. She has no complaints. I believe she is stable for outpatient management with antibiotics. Primary care follow-up advised and return precautions given. Patient discharged in the care of her daughter. Family with no unaddressed concerns.   Vitals:   05/21/16 1436 05/21/16 1655 05/21/16 1926  BP: 160/74 157/73 177/78  Pulse: 74 75 71  Resp: 17 16 18   Temp: 97.6 F (36.4 C)    TempSrc: Oral    SpO2: 98% 98% 99%  Height: 5\' 4"  (1.626 m)        Antonietta Breach, PA-C 05/21/16 2100    Sharlett Iles, MD 05/22/16 [phone]

## 2016-05-21 NOTE — ED Triage Notes (Signed)
Per EMS: pt from [facility]; pt c/o nausea and "room spinning" to staff today; pt is demented and is currently at her baseline being alert to person and place only

## 2016-05-23 LAB — URINE CULTURE

## 2016-11-18 NOTE — Telephone Encounter (Signed)
Error

## 2017-12-07 ENCOUNTER — Encounter (HOSPITAL_COMMUNITY): Payer: Self-pay | Admitting: Emergency Medicine

## 2017-12-07 ENCOUNTER — Inpatient Hospital Stay (HOSPITAL_COMMUNITY)
Admission: EM | Admit: 2017-12-07 | Discharge: 2018-01-06 | DRG: 871 | Disposition: E | Payer: Medicare Other | Source: Skilled Nursing Facility | Attending: Internal Medicine | Admitting: Internal Medicine

## 2017-12-07 ENCOUNTER — Emergency Department (HOSPITAL_COMMUNITY): Payer: Medicare Other

## 2017-12-07 DIAGNOSIS — R4189 Other symptoms and signs involving cognitive functions and awareness: Secondary | ICD-10-CM

## 2017-12-07 DIAGNOSIS — R6520 Severe sepsis without septic shock: Secondary | ICD-10-CM | POA: Diagnosis present

## 2017-12-07 DIAGNOSIS — Z79890 Hormone replacement therapy (postmenopausal): Secondary | ICD-10-CM

## 2017-12-07 DIAGNOSIS — R4182 Altered mental status, unspecified: Secondary | ICD-10-CM | POA: Diagnosis not present

## 2017-12-07 DIAGNOSIS — Z9012 Acquired absence of left breast and nipple: Secondary | ICD-10-CM

## 2017-12-07 DIAGNOSIS — I10 Essential (primary) hypertension: Secondary | ICD-10-CM | POA: Diagnosis present

## 2017-12-07 DIAGNOSIS — Z90710 Acquired absence of both cervix and uterus: Secondary | ICD-10-CM

## 2017-12-07 DIAGNOSIS — R739 Hyperglycemia, unspecified: Secondary | ICD-10-CM | POA: Diagnosis present

## 2017-12-07 DIAGNOSIS — I63532 Cerebral infarction due to unspecified occlusion or stenosis of left posterior cerebral artery: Secondary | ICD-10-CM | POA: Diagnosis present

## 2017-12-07 DIAGNOSIS — F419 Anxiety disorder, unspecified: Secondary | ICD-10-CM | POA: Diagnosis present

## 2017-12-07 DIAGNOSIS — Z79899 Other long term (current) drug therapy: Secondary | ICD-10-CM

## 2017-12-07 DIAGNOSIS — Z515 Encounter for palliative care: Secondary | ICD-10-CM

## 2017-12-07 DIAGNOSIS — Z853 Personal history of malignant neoplasm of breast: Secondary | ICD-10-CM

## 2017-12-07 DIAGNOSIS — E039 Hypothyroidism, unspecified: Secondary | ICD-10-CM | POA: Diagnosis present

## 2017-12-07 DIAGNOSIS — J690 Pneumonitis due to inhalation of food and vomit: Secondary | ICD-10-CM | POA: Diagnosis present

## 2017-12-07 DIAGNOSIS — Z66 Do not resuscitate: Secondary | ICD-10-CM

## 2017-12-07 DIAGNOSIS — I639 Cerebral infarction, unspecified: Secondary | ICD-10-CM | POA: Diagnosis present

## 2017-12-07 DIAGNOSIS — J9601 Acute respiratory failure with hypoxia: Secondary | ICD-10-CM | POA: Diagnosis present

## 2017-12-07 DIAGNOSIS — H9190 Unspecified hearing loss, unspecified ear: Secondary | ICD-10-CM | POA: Diagnosis present

## 2017-12-07 DIAGNOSIS — R29709 NIHSS score 9: Secondary | ICD-10-CM | POA: Diagnosis present

## 2017-12-07 DIAGNOSIS — A419 Sepsis, unspecified organism: Secondary | ICD-10-CM | POA: Diagnosis not present

## 2017-12-07 DIAGNOSIS — F03918 Unspecified dementia, unspecified severity, with other behavioral disturbance: Secondary | ICD-10-CM | POA: Diagnosis present

## 2017-12-07 DIAGNOSIS — E871 Hypo-osmolality and hyponatremia: Secondary | ICD-10-CM | POA: Diagnosis present

## 2017-12-07 DIAGNOSIS — Z7982 Long term (current) use of aspirin: Secondary | ICD-10-CM

## 2017-12-07 DIAGNOSIS — N3000 Acute cystitis without hematuria: Secondary | ICD-10-CM

## 2017-12-07 DIAGNOSIS — N390 Urinary tract infection, site not specified: Secondary | ICD-10-CM | POA: Diagnosis present

## 2017-12-07 DIAGNOSIS — R402432 Glasgow coma scale score 3-8, at arrival to emergency department: Secondary | ICD-10-CM | POA: Diagnosis present

## 2017-12-07 DIAGNOSIS — G9341 Metabolic encephalopathy: Secondary | ICD-10-CM | POA: Diagnosis present

## 2017-12-07 DIAGNOSIS — F0391 Unspecified dementia with behavioral disturbance: Secondary | ICD-10-CM | POA: Diagnosis present

## 2017-12-07 LAB — I-STAT VENOUS BLOOD GAS, ED
Acid-base deficit: 4 mmol/L — ABNORMAL HIGH (ref 0.0–2.0)
BICARBONATE: 22.6 mmol/L (ref 20.0–28.0)
O2 Saturation: 87 %
PCO2 VEN: 46.2 mmHg (ref 44.0–60.0)
PH VEN: 7.297 (ref 7.250–7.430)
TCO2: 24 mmol/L (ref 22–32)
pO2, Ven: 58 mmHg — ABNORMAL HIGH (ref 32.0–45.0)

## 2017-12-07 LAB — I-STAT CG4 LACTIC ACID, ED: Lactic Acid, Venous: 3.27 mmol/L (ref 0.5–1.9)

## 2017-12-07 LAB — CBG MONITORING, ED: Glucose-Capillary: 254 mg/dL — ABNORMAL HIGH (ref 70–99)

## 2017-12-07 MED ORDER — ONDANSETRON HCL 4 MG/2ML IJ SOLN
4.0000 mg | Freq: Once | INTRAMUSCULAR | Status: AC
  Administered 2017-12-07: 4 mg via INTRAVENOUS

## 2017-12-07 MED ORDER — ONDANSETRON HCL 4 MG/2ML IJ SOLN
INTRAMUSCULAR | Status: AC
  Administered 2017-12-07: 4 mg via INTRAVENOUS
  Filled 2017-12-07: qty 2

## 2017-12-07 MED ORDER — NALOXONE HCL 0.4 MG/ML IJ SOLN: 0.4000 mg | Freq: Once | INTRAMUSCULAR | Status: DC

## 2017-12-07 NOTE — ED Provider Notes (Signed)
[facility] EMERGENCY DEPARTMENT Provider Note   CSN: [id number] Arrival date & time: 01/04/2018  2253     History   Chief Complaint Chief Complaint  Patient presents with  . Aspiration   Level 5 caveat due to altered mental status HPI Sheri Shepherd is a 84 y.o. female.  The history is provided by the EMS personnel. The history is limited by the condition of the patient.  Altered Mental Status   This is a new problem. Episode onset: Unknown. The problem has been rapidly worsening. Associated symptoms include unresponsiveness.  Patient with history of anxiety, breast cancer, hypertension, dementia presents from nursing facility for altered mental status.  Per EMS report the patient was found unresponsive and the nursing staff at the facility began CPR immediately.  Patient began to respond after chest compressions started.  EMS arrived they placed her on nonrebreather, glucose is over 200.  Patient had vomiting in route to the ER.  Patient is very altered.  No other details known at this time  Past Medical History:  Diagnosis Date  . Anxiety   . Back pain   . Bronchitis    hx of  . Bruises easily   . Cancer ([place])    left Breast   . Diverticulosis   . Frequency of urination   . Hearing loss    bilateral ear  . History of colonic polyps   . Hx of thyroid nodule   . Hypertension   . Hypothyroid   . Memory loss   . PONV (postoperative nausea and vomiting)    Pt is "groggy" a while after surgery   . Vertigo     Patient Active Problem List   Diagnosis Date Noted  . Breast cancer ([place]) 10/03/2012  . Abdominal pain 01/14/2012  . Nausea & vomiting 01/14/2012  . HTN (hypertension) 01/14/2012  . Personal history of colonic polyps 01/14/2012  . Diverticulosis 01/14/2012  . Neck pain 01/14/2012    Past Surgical History:  Procedure Laterality Date  . ABDOMINAL HYSTERECTOMY  1978  . APPENDECTOMY    . CATARACT EXTRACTION, BILATERAL    . CERVICAL  FUSION     x2  . colonoscopy    . EYE SURGERY    . MASTECTOMY  1999   Left  . POSTERIOR CERVICAL FUSION/FORAMINOTOMY  02/18/2012   Procedure: POSTERIOR CERVICAL FUSION/FORAMINOTOMY LEVEL 4;  Surgeon: Erline Levine, MD;  Location: [facility];  Service: Neurosurgery;  Laterality: N/A;  Cervical four - seven  Posterior cervical decompression/fusion  . THYROIDECTOMY, PARTIAL    . TONSILLECTOMY       OB History   None      Home Medications    Prior to Admission medications   Medication Sig Start Date End Date Taking? Authorizing Provider  aspirin 325 MG tablet Take 325 mg by mouth daily as needed. For pain    [provider]  HYDROcodone-acetaminophen (NORCO/VICODIN) 5-325 MG tablet One half to one tablet every 8-12 hours as needed for pain 01/28/16   Darlyne Russian, MD  LORazepam (ATIVAN) 0.5 MG tablet TAKE 1/2 TABLET BY MOUTH AT NIGHT 04/25/15   Darlyne Russian, MD  Multiple Vitamin (MULTIVITAMIN WITH MINERALS) TABS Take 1 tablet by mouth daily.    [provider]  ramipril (ALTACE) 10 MG capsule TAKE 1 CAPSULE BY MOUTH EVERY DAY 04/16/15   Darlyne Russian, MD  ranitidine (ZANTAC) 150 MG capsule Take 1 capsule (150 mg total) by mouth 2 (two)  times daily as needed for heartburn. 05/21/16   Domenic Moras, PA-C  SYNTHROID 88 MCG tablet TAKE 1 TABLET BY MOUTH EVERY DAY A BREAKFAST 01/24/15   Darlyne Russian, MD    Family History History reviewed. No pertinent family history.  Social History Social History   Tobacco Use  . Smoking status: Never Smoker  . Smokeless tobacco: Never Used  Substance Use Topics  . Alcohol use: No  . Drug use: No     Allergies   Lyrica [pregabalin]; Penicillins; Prednisone; Tramadol; and Sulfa antibiotics   Review of Systems Review of Systems  Unable to perform ROS: Mental status change     Physical Exam Updated Vital Signs BP (!) 184/86 (BP Location: Right Arm)   Pulse (!) 103   Temp 98.1 F (36.7 C) (Oral)   Resp 17   SpO2  96%   Physical Exam CONSTITUTIONAL: Elderly, altered HEAD: Normocephalic/atraumatic EYES: Pinpoint pupils, eyes are deviated down and left ENMT: Mucous membranes moist, vomit on mouth NECK: supple no meningeal signs SPINE/BACK: Kyphotic spine, insert spine CV: S1/S2 noted, no murmurs/rubs/gallops noted LUNGS: coarse breath sounds bilaterally ABDOMEN: soft, nontender GU:no cva tenderness NEURO: Pt is unresponsive, will respond to pain, GCS 7 EXTREMITIES: pulses normal/equal, full ROM, no deformities SKIN: warm, color normal PSYCH: Unable to assess  ED Treatments / Results  Labs (all labs ordered are listed, but only abnormal results are displayed) Labs Reviewed  COMPREHENSIVE METABOLIC PANEL - Abnormal; Notable for the following components:      Result Value   Sodium 134 (*)    CO2 21 (*)    Glucose, Bld 248 (*)    GFR calc non Af Amer 59 (*)    All other components within normal limits  CBC WITH DIFFERENTIAL/PLATELET - Abnormal; Notable for the following components:   WBC 12.6 (*)    RBC 5.27 (*)    MCH 24.9 (*)    RDW 16.0 (*)    Neutro Abs 11.2 (*)    All other components within normal limits  URINALYSIS, COMPLETE (UACMP) WITH MICROSCOPIC - Abnormal; Notable for the following components:   APPearance CLOUDY (*)    Glucose, UA >=500 (*)    Ketones, ur 20 (*)    Nitrite POSITIVE (*)    Leukocytes, UA LARGE (*)    Bacteria, UA MANY (*)    All other components within normal limits  RAPID URINE DRUG SCREEN, HOSP PERFORMED - Abnormal; Notable for the following components:   Barbiturates   (*)    Value: Result not available. Reagent lot number recalled by manufacturer.   All other components within normal limits  LACTIC ACID, PLASMA - Abnormal; Notable for the following components:   Lactic Acid, Venous 3.6 (*)    All other components within normal limits  VALPROIC ACID LEVEL - Abnormal; Notable for the following components:   Valproic Acid Lvl <10 (*)    All other  components within normal limits  CBG MONITORING, ED - Abnormal; Notable for the following components:   Glucose-Capillary 254 (*)    All other components within normal limits  I-STAT VENOUS BLOOD GAS, ED - Abnormal; Notable for the following components:   pO2, Ven 58.0 (*)    Acid-base deficit 4.0 (*)    All other components within normal limits  I-STAT CG4 LACTIC ACID, ED - Abnormal; Notable for the following components:   Lactic Acid, Venous 3.27 (*)    All other components within normal limits  CULTURE, BLOOD (ROUTINE  X 2)  CULTURE, BLOOD (ROUTINE X 2)  URINE CULTURE  AMMONIA  ETHANOL  PROTIME-INR  TROPONIN I    EKG EKG Interpretation  Date/Time:  Tuesday December 07 2017 23:00:58 EDT Ventricular Rate:  114 PR Interval:    QRS Duration: 133 QT Interval:  356 QTC Calculation: 491 R Axis:   91 Text Interpretation:  Sinus tachycardia RBBB and LPFB Interpretation limited secondary to artifact Confirmed by Ripley Fraise [phone]) on 12/30/2017 11:06:16 PM   Radiology Ct Head Wo Contrast  Result Date: 12/15/2017 CLINICAL DATA:  Altered LOC EXAM: CT HEAD WITHOUT CONTRAST TECHNIQUE: Contiguous axial images were obtained from the base of the skull through the vertex without intravenous contrast. COMPARISON:  Report 03/16/2003 FINDINGS: Brain: Hypodensity within the left parasagittal occipital lobe. No hemorrhage or intracranial mass. Marked atrophy. Moderate to marked small vessel ischemic changes of the white matter. Probable old lacunar infarcts in the basal ganglia. Prominent ventricles, felt secondary to atrophy Vascular: No hyperdense vessels.  Carotid vascular calcification Skull: No fracture Sinuses/Orbits: Mucosal thickening in the maxillary and ethmoid sinuses. Other: Small foci of soft tissue gas within the soft tissues deep to the left mandible. IMPRESSION: 1. Hypodensity within the left occipital lobe is suspicious for acute to subacute infarct. No hemorrhage or midline shift. 2.  Atrophy with small vessel ischemic changes of the white matter. Electronically Signed   By: Donavan Foil M.D.   On: 12/31/2017 23:42   Dg Chest Port 1 View  Result Date: 12/17/2017 CLINICAL DATA:  Possible aspiration.  Altered mental status. EXAM: PORTABLE CHEST 1 VIEW COMPARISON:  02/17/2012 chest radiograph FINDINGS: Cardiomegaly noted. This is a mildly low volume film. There is no evidence of focal airspace disease, pulmonary edema, suspicious pulmonary nodule/mass, pleural effusion, or pneumothorax. No acute bony abnormalities are identified. IMPRESSION: Cardiomegaly without evidence of acute cardiopulmonary disease. Electronically Signed   By: Margarette Canada M.D.   On: 12/21/2017 23:53    Procedures Procedures  CRITICAL CARE Performed by: Sharyon Cable Total critical care time: 45 minutes Critical care time was exclusive of separately billable procedures and treating other patients. Critical care was necessary to treat or prevent imminent or life-threatening deterioration. Critical care was time spent personally by me on the following activities: development of treatment plan with patient and/or surrogate as well as nursing, discussions with consultants, evaluation of patient's response to treatment, examination of patient, obtaining history from patient or surrogate, ordering and performing treatments and interventions, ordering and review of laboratory studies, ordering and review of radiographic studies, pulse oximetry and re-evaluation of patient's condition. I have had conversation with multiple consultants, I discussed the case with daughter, I checked on patient frequently.  Medications Ordered in ED Medications  naloxone [facility]) injection 0.4 mg (0.4 mg Intravenous Not Given 01/04/2018 2316)  levofloxacin (LEVAQUIN) IVPB 750 mg (750 mg Intravenous New Bag/Given December 14, 2017 0119)  aztreonam (AZACTAM) 2 g in sodium chloride 0.9 % 100 mL IVPB (has no administration in time range)    levofloxacin (LEVAQUIN) 750 MG/150ML IVPB (  Not Given 12/14/17 0204)  fentaNYL (SUBLIMAZE) injection 12.5-25 mcg (has no administration in time range)  LORazepam (ATIVAN) injection 0.5 mg (has no administration in time range)  ondansetron (ZOFRAN) injection 4 mg (4 mg Intravenous Given 12/27/2017 2312)  lactated ringers bolus 1,000 mL (0 mLs Intravenous Stopped December 14, 2017 0203)    And  lactated ringers bolus 500 mL (500 mLs Intravenous New Bag/Given 12/14/17 0120)    And  lactated ringers bolus 250  mL (0 mLs Intravenous Stopped 12/15/17 0203)     Initial Impression / Assessment and Plan / ED Course  I have reviewed the triage vital signs and the nursing notes.  Pertinent labs & imaging results that were available during my care of the patient were reviewed by me and considered in my medical decision making (see chart for details).     11:28 PM Patient presents from nursing facility for unclear etiology.  GCS is 7, she is very altered.  Currently hemodynamically appropriate, no hypoxia.  She will need emergent CT head.  I have attempted to call the [place], but no answer at this time.  Will follow closely.  Currently she is a full code 12:03 AM CT head shows acute to subacute stroke, no ICH. Status unchanged.  Discussed with daughter Francia Greaves, who is  HPOA She reports the patient is a DNR.  Patient does not wish to be intubated.  She will be in the ER the next half hour as she is traveling from home.  She does report she had a recent urinary tract infection Work-up is pending at this time [phone] 12:24 AM Unclear cause of AMS Lactate elevated but afebrile She has evidence of stroke but unclear onset tPA in stroke considered but not given due to: Onset over 3-4.5hours Could also be metabolic, labs pending Will defer code sepsis/stroke for now 2:08 AM Patient found to have UTI as well, code sepsis called.  In combination with stroke on CT imaging, this could explain her mental status  changes.  Her mental status appears to be worsening, she is less responsive to pain at this time, she is very bradypneic with mouth breathing and is on a nonrebreather. Daughter is at bedside, we had a long discussion about the patient.  Daughter understands that her condition is very critical at this time.  Daughter confirms the patient is a DNR/DNI  Discussed the case with Dr. Myna Hidalgo with the hospitalist.  Of also discussed the case with Dr.Aroor with neurology.  Final Clinical Impressions(s) / ED Diagnoses   Final diagnoses:  Occipital stroke ([place])  Acute cystitis without hematuria  Sepsis, due to unspecified organism [facility])    ED Discharge Orders    None       Ripley Fraise, MD 15-Dec-2017 0210

## 2017-12-07 NOTE — ED Notes (Addendum)
Pt only responding to pain. GCS 7 at this time. MD Wickline aware of NIH scale and unresponsiveness.

## 2017-12-07 NOTE — ED Triage Notes (Addendum)
Pt arriving via [facility] from [place]. Facility reports pt was "aspirating in bed" so they did 20 chest compressions.  Facility did not check for pulses before starting the CPR.  Pt started responding to pain so they stopped chest compressions.  EMS placed pt on 10 L nonrebreather.  VS per EMS 262 CBG, p 90, 150/82, 100% nonrebreather.  Pt pupils not reactive per EMS.

## 2017-12-08 ENCOUNTER — Encounter (HOSPITAL_COMMUNITY): Payer: Self-pay | Admitting: Family Medicine

## 2017-12-08 ENCOUNTER — Inpatient Hospital Stay (HOSPITAL_COMMUNITY): Payer: Medicare Other

## 2017-12-08 DIAGNOSIS — F0391 Unspecified dementia with behavioral disturbance: Secondary | ICD-10-CM | POA: Diagnosis not present

## 2017-12-08 DIAGNOSIS — N390 Urinary tract infection, site not specified: Secondary | ICD-10-CM | POA: Diagnosis present

## 2017-12-08 DIAGNOSIS — Z66 Do not resuscitate: Secondary | ICD-10-CM | POA: Diagnosis not present

## 2017-12-08 DIAGNOSIS — E039 Hypothyroidism, unspecified: Secondary | ICD-10-CM | POA: Diagnosis not present

## 2017-12-08 DIAGNOSIS — R4189 Other symptoms and signs involving cognitive functions and awareness: Secondary | ICD-10-CM

## 2017-12-08 DIAGNOSIS — Z90710 Acquired absence of both cervix and uterus: Secondary | ICD-10-CM | POA: Diagnosis not present

## 2017-12-08 DIAGNOSIS — I639 Cerebral infarction, unspecified: Secondary | ICD-10-CM | POA: Diagnosis not present

## 2017-12-08 DIAGNOSIS — Z9012 Acquired absence of left breast and nipple: Secondary | ICD-10-CM | POA: Diagnosis not present

## 2017-12-08 DIAGNOSIS — I63532 Cerebral infarction due to unspecified occlusion or stenosis of left posterior cerebral artery: Secondary | ICD-10-CM | POA: Diagnosis present

## 2017-12-08 DIAGNOSIS — R739 Hyperglycemia, unspecified: Secondary | ICD-10-CM | POA: Diagnosis present

## 2017-12-08 DIAGNOSIS — J9601 Acute respiratory failure with hypoxia: Secondary | ICD-10-CM | POA: Diagnosis present

## 2017-12-08 DIAGNOSIS — J690 Pneumonitis due to inhalation of food and vomit: Secondary | ICD-10-CM | POA: Diagnosis present

## 2017-12-08 DIAGNOSIS — Z515 Encounter for palliative care: Secondary | ICD-10-CM

## 2017-12-08 DIAGNOSIS — R402432 Glasgow coma scale score 3-8, at arrival to emergency department: Secondary | ICD-10-CM | POA: Diagnosis present

## 2017-12-08 DIAGNOSIS — A419 Sepsis, unspecified organism: Secondary | ICD-10-CM | POA: Diagnosis not present

## 2017-12-08 DIAGNOSIS — R29709 NIHSS score 9: Secondary | ICD-10-CM | POA: Diagnosis present

## 2017-12-08 DIAGNOSIS — Z853 Personal history of malignant neoplasm of breast: Secondary | ICD-10-CM | POA: Diagnosis not present

## 2017-12-08 DIAGNOSIS — G9341 Metabolic encephalopathy: Secondary | ICD-10-CM | POA: Diagnosis present

## 2017-12-08 DIAGNOSIS — R4182 Altered mental status, unspecified: Secondary | ICD-10-CM | POA: Diagnosis present

## 2017-12-08 DIAGNOSIS — I10 Essential (primary) hypertension: Secondary | ICD-10-CM | POA: Diagnosis present

## 2017-12-08 DIAGNOSIS — Z7982 Long term (current) use of aspirin: Secondary | ICD-10-CM | POA: Diagnosis not present

## 2017-12-08 DIAGNOSIS — F419 Anxiety disorder, unspecified: Secondary | ICD-10-CM | POA: Diagnosis present

## 2017-12-08 DIAGNOSIS — Z79899 Other long term (current) drug therapy: Secondary | ICD-10-CM | POA: Diagnosis not present

## 2017-12-08 DIAGNOSIS — F03918 Unspecified dementia, unspecified severity, with other behavioral disturbance: Secondary | ICD-10-CM | POA: Diagnosis present

## 2017-12-08 DIAGNOSIS — H9190 Unspecified hearing loss, unspecified ear: Secondary | ICD-10-CM | POA: Diagnosis present

## 2017-12-08 DIAGNOSIS — E871 Hypo-osmolality and hyponatremia: Secondary | ICD-10-CM | POA: Diagnosis present

## 2017-12-08 DIAGNOSIS — R6520 Severe sepsis without septic shock: Secondary | ICD-10-CM | POA: Diagnosis present

## 2017-12-08 DIAGNOSIS — Z79890 Hormone replacement therapy (postmenopausal): Secondary | ICD-10-CM | POA: Diagnosis not present

## 2017-12-08 DIAGNOSIS — N3000 Acute cystitis without hematuria: Secondary | ICD-10-CM | POA: Diagnosis present

## 2017-12-08 LAB — URINALYSIS, COMPLETE (UACMP) WITH MICROSCOPIC
Bilirubin Urine: NEGATIVE
Hgb urine dipstick: NEGATIVE
Ketones, ur: 20 mg/dL — AB
NITRITE: POSITIVE — AB
Protein, ur: NEGATIVE mg/dL
Specific Gravity, Urine: 1.012 (ref 1.005–1.030)
pH: 7 (ref 5.0–8.0)

## 2017-12-08 LAB — COMPREHENSIVE METABOLIC PANEL
ALBUMIN: 3.5 g/dL (ref 3.5–5.0)
ALT: 15 U/L (ref 0–44)
AST: 28 U/L (ref 15–41)
Alkaline Phosphatase: 109 U/L (ref 38–126)
Anion gap: 13 (ref 5–15)
BILIRUBIN TOTAL: 0.7 mg/dL (ref 0.3–1.2)
BUN: 9 mg/dL (ref 8–23)
CHLORIDE: 100 mmol/L (ref 98–111)
CO2: 21 mmol/L — ABNORMAL LOW (ref 22–32)
Calcium: 9.1 mg/dL (ref 8.9–10.3)
Creatinine, Ser: 0.89 mg/dL (ref 0.44–1.00)
GFR calc Af Amer: >60 mL/min (ref >60)
GFR calc non Af Amer: 59 mL/min — ABNORMAL LOW (ref >60)
GLUCOSE: 248 mg/dL — AB (ref 70–99)
POTASSIUM: 3.9 mmol/L (ref 3.5–5.1)
SODIUM: 134 mmol/L — AB (ref 135–145)
TOTAL PROTEIN: 7.2 g/dL (ref 6.5–8.1)

## 2017-12-08 LAB — CBC WITH DIFFERENTIAL/PLATELET
ABS IMMATURE GRANULOCYTES: 0.1 10*3/uL (ref 0.0–0.1)
BASOS ABS: 0 10*3/uL (ref 0.0–0.1)
Basophils Relative: 0 %
Eosinophils Absolute: 0 10*3/uL (ref 0.0–0.7)
Eosinophils Relative: 0 %
HCT: 41.9 % (ref 36.0–46.0)
HEMOGLOBIN: 13.1 g/dL (ref 12.0–15.0)
Immature Granulocytes: 1 %
LYMPHS ABS: 1.1 10*3/uL (ref 0.7–4.0)
LYMPHS PCT: 8 %
MCH: 24.9 pg — AB (ref 26.0–34.0)
MCHC: 31.3 g/dL (ref 30.0–36.0)
MCV: 79.5 fL (ref 78.0–100.0)
MONO ABS: 0.2 10*3/uL (ref 0.1–1.0)
Monocytes Relative: 2 %
Neutro Abs: 11.2 10*3/uL — ABNORMAL HIGH (ref 1.7–7.7)
Neutrophils Relative %: 89 %
Platelets: 279 10*3/uL (ref 150–400)
RBC: 5.27 MIL/uL — AB (ref 3.87–5.11)
RDW: 16 % — ABNORMAL HIGH (ref 11.5–15.5)
WBC: 12.6 10*3/uL — ABNORMAL HIGH (ref 4.0–10.5)

## 2017-12-08 LAB — LACTIC ACID, PLASMA
LACTIC ACID, VENOUS: 1.3 mmol/L (ref 0.5–1.9)
LACTIC ACID, VENOUS: 2.4 mmol/L — AB (ref 0.5–1.9)
LACTIC ACID, VENOUS: 3.6 mmol/L — AB (ref 0.5–1.9)

## 2017-12-08 LAB — LIPID PANEL
CHOLESTEROL: 176 mg/dL (ref 0–200)
HDL: 46 mg/dL (ref >40)
LDL Cholesterol: 123 mg/dL — ABNORMAL HIGH (ref 0–99)
Total CHOL/HDL Ratio: 3.8 RATIO
Triglycerides: 36 mg/dL (ref <150)
VLDL: 7 mg/dL (ref 0–40)

## 2017-12-08 LAB — RAPID URINE DRUG SCREEN, HOSP PERFORMED
Amphetamines: NOT DETECTED
Benzodiazepines: NOT DETECTED
Cocaine: NOT DETECTED
Opiates: NOT DETECTED
Tetrahydrocannabinol: NOT DETECTED

## 2017-12-08 LAB — HEMOGLOBIN A1C
HEMOGLOBIN A1C: 6.3 % — AB (ref 4.8–5.6)
MEAN PLASMA GLUCOSE: 134.11 mg/dL

## 2017-12-08 LAB — PROTIME-INR
INR: 1.06
PROTHROMBIN TIME: 13.7 s (ref 11.4–15.2)

## 2017-12-08 LAB — MRSA PCR SCREENING: MRSA by PCR: NEGATIVE

## 2017-12-08 LAB — ETHANOL: Alcohol, Ethyl (B): <10 mg/dL (ref <10)

## 2017-12-08 LAB — VALPROIC ACID LEVEL: Valproic Acid Lvl: <10 ug/mL — ABNORMAL LOW (ref 50.0–100.0)

## 2017-12-08 LAB — AMMONIA: AMMONIA: 14 umol/L (ref 9–35)

## 2017-12-08 LAB — TROPONIN I: Troponin I: <0.03 ng/mL (ref <0.03)

## 2017-12-08 MED ORDER — SODIUM CHLORIDE 0.9 % IV SOLN
INTRAVENOUS | Status: DC
  Administered 2017-12-08: 05:00:00 via INTRAVENOUS

## 2017-12-08 MED ORDER — LACTATED RINGERS IV BOLUS (SEPSIS)
1000.0000 mL | Freq: Once | INTRAVENOUS | Status: AC
  Administered 2017-12-08: 1000 mL via INTRAVENOUS

## 2017-12-08 MED ORDER — SODIUM CHLORIDE 0.9 % IV SOLN: 2.0000 g | Freq: Once | INTRAVENOUS | Status: DC

## 2017-12-08 MED ORDER — FAMOTIDINE IN NACL 20-0.9 MG/50ML-% IV SOLN: 20.0000 mg | Freq: Two times a day (BID) | INTRAVENOUS | Status: DC

## 2017-12-08 MED ORDER — ACETAMINOPHEN 650 MG RE SUPP: 650.0000 mg | RECTAL | Status: DC | PRN

## 2017-12-08 MED ORDER — ACETAMINOPHEN 325 MG PO TABS: 650.0000 mg | ORAL_TABLET | ORAL | Status: DC | PRN

## 2017-12-08 MED ORDER — ENOXAPARIN SODIUM 40 MG/0.4ML ~~LOC~~ SOLN: 40.0000 mg | SUBCUTANEOUS | Status: DC

## 2017-12-08 MED ORDER — ONDANSETRON 4 MG PO TBDP: 4.0000 mg | ORAL_TABLET | Freq: Four times a day (QID) | ORAL | Status: DC | PRN

## 2017-12-08 MED ORDER — ONDANSETRON HCL 4 MG/2ML IJ SOLN: 4.0000 mg | Freq: Four times a day (QID) | INTRAMUSCULAR | Status: DC | PRN

## 2017-12-08 MED ORDER — LEVOFLOXACIN IN D5W 750 MG/150ML IV SOLN
750.0000 mg | Freq: Once | INTRAVENOUS | Status: AC
  Administered 2017-12-08: 750 mg via INTRAVENOUS

## 2017-12-08 MED ORDER — LACTATED RINGERS IV BOLUS (SEPSIS)
500.0000 mL | Freq: Once | INTRAVENOUS | Status: AC
  Administered 2017-12-08: 500 mL via INTRAVENOUS

## 2017-12-08 MED ORDER — LEVOFLOXACIN IN D5W 750 MG/150ML IV SOLN: 750.0000 mg | INTRAVENOUS | Status: DC

## 2017-12-08 MED ORDER — FENTANYL CITRATE (PF) 100 MCG/2ML IJ SOLN
12.5000 ug | INTRAMUSCULAR | Status: DC | PRN
  Administered 2017-12-08 (×3): 25 ug via INTRAVENOUS
  Filled 2017-12-08 (×3): qty 2

## 2017-12-08 MED ORDER — DIPHENHYDRAMINE HCL 50 MG/ML IJ SOLN: 12.5000 mg | INTRAMUSCULAR | Status: DC | PRN

## 2017-12-08 MED ORDER — ATROPINE SULFATE 1 % OP SOLN
4.0000 [drp] | OPHTHALMIC | Status: DC | PRN
  Filled 2017-12-08: qty 2

## 2017-12-08 MED ORDER — ASPIRIN 325 MG PO TABS: 325.0000 mg | ORAL_TABLET | Freq: Every day | ORAL | Status: DC

## 2017-12-08 MED ORDER — ACETAMINOPHEN 160 MG/5ML PO SOLN: 650.0000 mg | ORAL | Status: DC | PRN

## 2017-12-08 MED ORDER — SODIUM CHLORIDE 0.9 % IV BOLUS
500.0000 mL | Freq: Once | INTRAVENOUS | Status: AC
  Administered 2017-12-08: 500 mL via INTRAVENOUS

## 2017-12-08 MED ORDER — SODIUM CHLORIDE 0.9 % IV SOLN
1.0000 g | Freq: Three times a day (TID) | INTRAVENOUS | Status: DC
  Filled 2017-12-08: qty 1

## 2017-12-08 MED ORDER — STROKE: EARLY STAGES OF RECOVERY BOOK
Freq: Once | Status: AC
  Administered 2017-12-08: 04:00:00
  Filled 2017-12-08: qty 1

## 2017-12-08 MED ORDER — LACTATED RINGERS IV BOLUS (SEPSIS)
250.0000 mL | Freq: Once | INTRAVENOUS | Status: AC
  Administered 2017-12-08: 250 mL via INTRAVENOUS

## 2017-12-08 MED ORDER — GLYCOPYRROLATE 0.2 MG/ML IJ SOLN
0.4000 mg | Freq: Four times a day (QID) | INTRAMUSCULAR | Status: DC
  Administered 2017-12-08 (×2): 0.4 mg via INTRAVENOUS
  Filled 2017-12-08 (×2): qty 2

## 2017-12-08 MED ORDER — LORAZEPAM 2 MG/ML IJ SOLN: 1.0000 mg | INTRAMUSCULAR | Status: DC | PRN

## 2017-12-08 MED ORDER — GLYCOPYRROLATE 0.2 MG/ML IJ SOLN: 0.2000 mg | Freq: Three times a day (TID) | INTRAMUSCULAR | Status: DC

## 2017-12-08 MED ORDER — ASPIRIN 300 MG RE SUPP: 300.0000 mg | Freq: Every day | RECTAL | Status: DC

## 2017-12-08 MED ORDER — LEVOFLOXACIN IN D5W 750 MG/150ML IV SOLN
INTRAVENOUS | Status: AC
  Filled 2017-12-08: qty 150

## 2017-12-08 MED ORDER — MORPHINE SULFATE (PF) 2 MG/ML IV SOLN
2.0000 mg | INTRAVENOUS | Status: DC | PRN
  Filled 2017-12-08: qty 1

## 2017-12-08 MED ORDER — LORAZEPAM 2 MG/ML IJ SOLN
0.5000 mg | INTRAMUSCULAR | Status: DC | PRN
  Administered 2017-12-08: 0.5 mg via INTRAVENOUS
  Filled 2017-12-08: qty 1

## 2017-12-10 LAB — URINE CULTURE: Culture: >=100000 — AB

## 2017-12-13 LAB — CULTURE, BLOOD (ROUTINE X 2)
CULTURE: NO GROWTH
Culture: NO GROWTH
SPECIAL REQUESTS: ADEQUATE
Special Requests: ADEQUATE

## 2018-01-06 NOTE — CV Procedure (Signed)
2D echo attempted but patient has been placed on comfort care, per RN Ermalene Postin.

## 2018-01-06 NOTE — Progress Notes (Addendum)
PROGRESS NOTE        PATIENT DETAILS Name: Sheri Shepherd Age: 84 y.o. Sex: female Date of Birth: 21-Jan-1933 Admit Date: 12/21/2017 Admitting Physician Vianne Bulls, MD [id number], Loura Back, MD  Brief Narrative: Patient is a 84 y.o. female is a 84 year old female with history of hypertension, recurrent UTI, dementia presented to the hospital after being found down at her assisted living facility.  Apparently a few hours prior to her being found unresponsive-she was complaining of unsteady gait and visual disturbance.  She was last seen normal by staff at the nursing facility around 7 PM--around approximately 10 PM she was found unresponsive-apparently CPR was briefly started, patient was subsequently brought to the emergency room for further evaluation and treatment.  CT of the head showed a suspicious left occipital infarct, she was also thought to have sepsis secondary to UTI and admitted to the hospitalist service.  See below for further details  Subjective: Unresponsive-no response to vigorous sternal rub.  Gurgling-having periods of apnea during my evaluation.  Assessment/Plan: Acute metabolic encephalopathy: Secondary to sepsis, but also acute CVA.  Patient is gurgling and aspirating-already having some apneic spells.  Further work-up-will not change management.  She is a DNR-daughter at bedside clearly states that patient did not desire aggressive care.  After extensive discussion with daughter-and also with palliative care who later presented to bedside-have  transitioned her to full comfort measures.  Acute CVA: Rapidly deteriorating-aspirating overtly-Further work-up we will not change management.  Will discontinue MRI and further stroke work-up.  No role for antiplatelets in the setting.  Acute hypoxic respiratory failure secondary to aspiration pneumonitis: Suspect aspiration is secondary to acute CVA.  Actively aspirating and accumulating secretions-she  is actively dying.  Daughter is very clear-patient did not want any artificial means to prolong her life.  DNR in place.  Palliative care following.  Comfort measures in effect.  Sepsis secondary to UTI and aspiration pneumonia: Comfort measures in effect-all antimicrobial therapy was discontinued.  Dementia with behavioral disturbance: Supportive care only-full comfort measures in effect  Hypothyroidism: Stop Synthroid-full comfort measures in effect.  Advanced directive/palliative care: DNR in place-daughter at bedside-patient did not want any aggressive means to prolong life.  She is deteriorating rapidly-actively aspirating.  After extensive discussion-we have transition her to full comfort measures.  DVT Prophylaxis:SCD's  Code Status:  DNR  Family Communication: Daughter at bedside  Disposition Plan: Remain inpatient-expect inpatient death  Antimicrobial agents: Anti-infectives (From admission, onward)   Start     Dose/Rate Route Frequency Ordered Stop   12/10/17 0000  levofloxacin (LEVAQUIN) IVPB 750 mg  Status:  Discontinued     750 mg 100 mL/hr over 90 Minutes Intravenous Every 48 hours 2018-01-04 0401 January 04, 2018 0915   01/04/18 1400  aztreonam (AZACTAM) 1 g in sodium chloride 0.9 % 100 mL IVPB  Status:  Discontinued     1 g 200 mL/hr over 30 Minutes Intravenous Every 8 hours 01/04/18 0401 01/04/18 0915   2018/01/04 0116  levofloxacin (LEVAQUIN) 750 MG/150ML IVPB    Note to Pharmacy:  Antonietta Barcelona   : cabinet override      Jan 04, 2018 0116 01-04-18 1329   2018/01/04 0100  levofloxacin (LEVAQUIN) IVPB 750 mg     750 mg 100 mL/hr over 90 Minutes Intravenous  Once 2018-01-04 0055 01/04/18 0249   04-Jan-2018 0100  aztreonam (AZACTAM)  2 g in sodium chloride 0.9 % 100 mL IVPB  Status:  Discontinued     2 g 200 mL/hr over 30 Minutes Intravenous  Once 12/18/2017 0055 12-18-2017 0915      Procedures: None  CONSULTS:  neurology and Palliative care  Time spent: 40 minutes-Greater than 50%  of this time was spent in counseling, explanation of diagnosis, planning of further management, and coordination of care.  MEDICATIONS: Scheduled Meds: . glycopyrrolate  0.4 mg Intravenous QID   Continuous Infusions: . levofloxacin     PRN Meds:.acetaminophen **OR** acetaminophen (TYLENOL) oral liquid 160 mg/5 mL **OR** acetaminophen, atropine, diphenhydrAMINE, LORazepam, morphine injection, ondansetron **OR** ondansetron (ZOFRAN) IV   PHYSICAL EXAM: Vital signs: Vitals:   December 18, 2017 0500 2017/12/18 0700 2017-12-18 0900 18-Dec-2017 1039  BP: (!) 132/49 (!) 118/52 122/83   Pulse: 78  72   Resp: 12   (!) 8  Temp: 97.7 F (36.5 C)  98.3 F (36.8 C)   TempSrc: Oral Oral Oral   SpO2: 100% 100% 100% 100%  Weight:      Height:       Filed Weights   12/18/2017 0317  Weight: 61.7 kg (136 lb 0.4 oz)   Body mass index is 24.88 kg/m.   General appearance : Unresponsive-on 100% nonrebreather mask.  Accumulating secretions and gurgling. Eyes:.Pink conjunctiva HEENT: Atraumatic and Normocephalic Neck: supple Resp:Good air entry bilaterally, significant amount of transmitted upper airway sounds CVS: S1 S2 regular, no murmurs.  GI: Bowel sounds present, Non tender and not distended with no gaurding, rigidity or rebound.No organomegaly Extremities: B/L Lower Ext shows no edema, both legs are warm to touch Neurology: Unresponsive-unable to evaluate. Musculoskeletal:No digital cyanosis Skin:No Rash, warm and dry Wounds:N/A  I have personally reviewed following labs and imaging studies  LABORATORY DATA: CBC: Recent Labs  Lab 12/14/2017 2319  WBC 12.6*  NEUTROABS 11.2*  HGB 13.1  HCT 41.9  MCV 79.5  PLT 622    Basic Metabolic Panel: Recent Labs  Lab 12/19/2017 2319  NA 134*  K 3.9  CL 100  CO2 21*  GLUCOSE 248*  BUN 9  CREATININE 0.89  CALCIUM 9.1    GFR: Estimated Creatinine Clearance: 42.1 mL/min (by C-G formula based on SCr of 0.89 mg/dL).  Liver Function Tests: Recent  Labs  Lab 12/20/2017 2319  AST 28  ALT 15  ALKPHOS 109  BILITOT 0.7  PROT 7.2  ALBUMIN 3.5   No results for input(s): LIPASE, AMYLASE in the last 168 hours. Recent Labs  Lab 01/01/2018 2353  AMMONIA 14    Coagulation Profile: Recent Labs  Lab 12/27/2017 2319  INR 1.06    Cardiac Enzymes: Recent Labs  Lab 01/02/2018 2359  TROPONINI <0.03    BNP (last 3 results) No results for input(s): PROBNP in the last 8760 hours.  HbA1C: Recent Labs    12/18/17 0355  HGBA1C 6.3*    CBG: Recent Labs  Lab 12/06/2017 2318  GLUCAP 254*    Lipid Profile: Recent Labs    12-18-17 0355  CHOL 176  HDL 46  LDLCALC 123*  TRIG 36  CHOLHDL 3.8    Thyroid Function Tests: No results for input(s): TSH, T4TOTAL, FREET4, T3FREE, THYROIDAB in the last 72 hours.  Anemia Panel: No results for input(s): VITAMINB12, FOLATE, FERRITIN, TIBC, IRON, RETICCTPCT in the last 72 hours.  Urine analysis:    Component Value Date/Time   COLORURINE YELLOW 12/18/17 0013   APPEARANCEUR CLOUDY (A) 2017/12/18 0013   LABSPEC 1.012 December 18, 2017  0013   PHURINE 7.0 December 17, 2017 0013   GLUCOSEU >=500 (A) 17-Dec-2017 0013   HGBUR NEGATIVE 12/17/17 0013   BILIRUBINUR NEGATIVE 2017-12-17 0013   BILIRUBINUR neg 11/21/2013 1618   KETONESUR 20 (A) 12-17-17 0013   PROTEINUR NEGATIVE 17-Dec-2017 0013   UROBILINOGEN 0.2 11/21/2013 1618   UROBILINOGEN 1.0 01/14/2012 0804   NITRITE POSITIVE (A) 12-17-17 0013   LEUKOCYTESUR LARGE (A) 2017/12/17 0013    Sepsis Labs: Lactic Acid, Venous    Component Value Date/Time   LATICACIDVEN 1.3 December 17, 2017 0554    MICROBIOLOGY: Recent Results (from the past 240 hour(s))  MRSA PCR Screening     Status: None   Collection Time: 12-17-17  6:44 AM  Result Value Ref Range Status   MRSA by PCR NEGATIVE NEGATIVE Final    Comment:        The GeneXpert MRSA Assay (FDA approved for NASAL specimens only), is one component of a comprehensive MRSA  colonization surveillance program. It is not intended to diagnose MRSA infection nor to guide or monitor treatment for MRSA infections. Performed at [facility], [place] [address], [place], [place] [address]     RADIOLOGY STUDIES/RESULTS: Ct Head Wo Contrast  Result Date: 12/12/2017 CLINICAL DATA:  Altered LOC EXAM: CT HEAD WITHOUT CONTRAST TECHNIQUE: Contiguous axial images were obtained from the base of the skull through the vertex without intravenous contrast. COMPARISON:  Report 03/16/2003 FINDINGS: Brain: Hypodensity within the left parasagittal occipital lobe. No hemorrhage or intracranial mass. Marked atrophy. Moderate to marked small vessel ischemic changes of the white matter. Probable old lacunar infarcts in the basal ganglia. Prominent ventricles, felt secondary to atrophy Vascular: No hyperdense vessels.  Carotid vascular calcification Skull: No fracture Sinuses/Orbits: Mucosal thickening in the maxillary and ethmoid sinuses. Other: Small foci of soft tissue gas within the soft tissues deep to the left mandible. IMPRESSION: 1. Hypodensity within the left occipital lobe is suspicious for acute to subacute infarct. No hemorrhage or midline shift. 2. Atrophy with small vessel ischemic changes of the white matter. Electronically Signed   By: Donavan Foil M.D.   On: 12/13/2017 23:42   Dg Chest Port 1 View  Result Date: 12/07/2017 CLINICAL DATA:  Possible aspiration.  Altered mental status. EXAM: PORTABLE CHEST 1 VIEW COMPARISON:  02/17/2012 chest radiograph FINDINGS: Cardiomegaly noted. This is a mildly low volume film. There is no evidence of focal airspace disease, pulmonary edema, suspicious pulmonary nodule/mass, pleural effusion, or pneumothorax. No acute bony abnormalities are identified. IMPRESSION: Cardiomegaly without evidence of acute cardiopulmonary disease. Electronically Signed   By: Margarette Canada M.D.   On: 12/07/2017 23:53     LOS: 0 days   Oren Binet, MD  [facility]  If 7PM-7AM, please contact night-coverage  Please page via [email or web address]-click on MD name and type text message  12/17/2017, 11:42 AM

## 2018-01-06 NOTE — Consult Note (Addendum)
Requesting Physician: Dr. Christy Gentles    Chief Complaint: AMS  History obtained from: Chart     HPI:                                                                                                                                       Sheri Shepherd is an 84 y.o. female with dementia who resides in nursing home, hypertension, recurrent UTI  presents after being found unresponsive in the nursing home. Work-up in the ED showed patient appeared to be in sepsis.  C  T head showed subacute occipital lobe infarction.  Neurology was consulted for further work-up of patient's stroke.   Date last known well: 7.1.19 Time last known well: unclear tPA Given: no, outside window Baseline MRS 5     Past Medical History:  Diagnosis Date  . Anxiety   . Back pain   . Bronchitis    hx of  . Bruises easily   . Cancer ([place])    left Breast   . Diverticulosis   . Frequency of urination   . Hearing loss    bilateral ear  . History of colonic polyps   . Hx of thyroid nodule   . Hypertension   . Hypothyroid   . Memory loss   . PONV (postoperative nausea and vomiting)    Pt is "groggy" a while after surgery   . Vertigo     Past Surgical History:  Procedure Laterality Date  . ABDOMINAL HYSTERECTOMY  1978  . APPENDECTOMY    . CATARACT EXTRACTION, BILATERAL    . CERVICAL FUSION     x2  . colonoscopy    . EYE SURGERY    . MASTECTOMY  1999   Left  . POSTERIOR CERVICAL FUSION/FORAMINOTOMY  02/18/2012   Procedure: POSTERIOR CERVICAL FUSION/FORAMINOTOMY LEVEL 4;  Surgeon: Erline Levine, MD;  Location: [facility];  Service: Neurosurgery;  Laterality: N/A;  Cervical four - seven  Posterior cervical decompression/fusion  . THYROIDECTOMY, PARTIAL    . TONSILLECTOMY      Family History  Problem Relation Age of Onset  . Healthy Daughter    Social History:  reports that she has never smoked. She has never used smokeless tobacco. She reports that she does not drink alcohol or use  drugs.  Allergies:  Allergies  Allergen Reactions  . Lyrica [Pregabalin] Other (See Comments)    (Already present in Epic, but not noted on MAR from [facility], however??)  . Penicillins Other (See Comments)    Heart palpitations (Already present in Epic, but not noted on [facility] from [facility], however??) Has patient had a PCN reaction causing immediate rash, facial/tongue/throat swelling, SOB or lightheadedness with hypotension: Unk Has patient had a PCN reaction causing severe rash involving mucus membranes or skin necrosis: Unk Has patient had a PCN reaction that required hospitalization: Unk Has patient had a PCN reaction occurring within  the last 10 years: Unk If all of the above answers are "NO",   . Prednisone Other (See Comments)    Elevated heart rate (Already present in Epic, but not noted on [facility] from [facility], however??)  . Tramadol Other (See Comments)    Severe mood swings and confusion (Already present in Epic, but not noted on [facility] from [facility], however??)  . Sulfa Antibiotics Rash    Red, swollen areas, as well (Already present in Epic, but not noted on MAR from [facility], however??)     Medications:                                                                                                                        I reviewed home medications   ROS:                                                                                                                                     Unable to obtain    Examination:                                                                                                      General: Appears thin Psych: Affect appropriate to situation Eyes: No scleral injection HENT: No OP obstrucion Head: Normocephalic.  Cardiovascular: Normal rate and regular rhythm.  Respiratory: Effort normal and breath sounds normal to anterior ascultation GI: Soft.  No distension. There is no tenderness.  Skin: WDI     Neurological Examination: Pending  Mental Status: Lethargic, not following commands Cranial Nerves: Pupils are pinpoint, no forced gaze deviation,  no facial droop  Motor: withdraws minimally to noxious stimuli Sensory: unable to assess Plantars: Right: downgoing   Left: downgoing Cerebellar: Unable to assess Gait: Unable to assess     Lab Results: Basic Metabolic Panel: Recent Labs  Lab 01/04/2018 2319  NA 134*  K 3.9  CL 100  CO2 21*  GLUCOSE 248*  BUN 9  CREATININE 0.89  CALCIUM 9.1    CBC: Recent Labs  Lab 12/10/2017 2319  WBC 12.6*  NEUTROABS 11.2*  HGB 13.1  HCT 41.9  MCV 79.5  PLT 279    Coagulation Studies: Recent Labs    12/12/2017 2319  LABPROT 13.7  INR 1.06    Imaging: Ct Head Wo Contrast  Result Date: 12/28/2017 CLINICAL DATA:  Altered LOC EXAM: CT HEAD WITHOUT CONTRAST TECHNIQUE: Contiguous axial images were obtained from the base of the skull through the vertex without intravenous contrast. COMPARISON:  Report 03/16/2003 FINDINGS: Brain: Hypodensity within the left parasagittal occipital lobe. No hemorrhage or intracranial mass. Marked atrophy. Moderate to marked small vessel ischemic changes of the white matter. Probable old lacunar infarcts in the basal ganglia. Prominent ventricles, felt secondary to atrophy Vascular: No hyperdense vessels.  Carotid vascular calcification Skull: No fracture Sinuses/Orbits: Mucosal thickening in the maxillary and ethmoid sinuses. Other: Small foci of soft tissue gas within the soft tissues deep to the left mandible. IMPRESSION: 1. Hypodensity within the left occipital lobe is suspicious for acute to subacute infarct. No hemorrhage or midline shift. 2. Atrophy with small vessel ischemic changes of the white matter. Electronically Signed   By: Donavan Foil M.D.   On: 12/17/2017 23:42   Dg Chest Port 1 View  Result Date: 12/26/2017 CLINICAL DATA:  Possible aspiration.  Altered mental status. EXAM: PORTABLE CHEST 1  VIEW COMPARISON:  02/17/2012 chest radiograph FINDINGS: Cardiomegaly noted. This is a mildly low volume film. There is no evidence of focal airspace disease, pulmonary edema, suspicious pulmonary nodule/mass, pleural effusion, or pneumothorax. No acute bony abnormalities are identified. IMPRESSION: Cardiomegaly without evidence of acute cardiopulmonary disease. Electronically Signed   By: Margarette Canada M.D.   On: 01/05/2018 23:53     ASSESSMENT AND PLAN  84 y.o. female with dementia who resides in nursing home, hypertension since to the emergency room after being found unresponsive.  Work-up reveals a subacute left PCA stroke as well as patient being septic.  Daughter also concerned about prognosis, feels if patient has a large stroke will push for comfort care.  Subacute Left  PCA stroke  Recommend # MRI of the brain without contrast - will revisit goals of care  depending on size of stroke  # Start patient on ASA 325mg  daily #Start or continue Atorvastatin 80 mg/other high intensity statin # BP goal: permissive HTN upto 220/110 mmHg # HBAIC and Lipid profile # Telemetry monitoring # Frequent neuro checks # NPO until passes stroke swallow screen  Please page stroke NP  Or  PA  Or MD from 8am -4 pm  as this patient from this time will be  followed by the stroke.   You can look them up on [email or web address]  Password [facility]    Sushanth Aroor [facility] Pager Number [phone]

## 2018-01-06 NOTE — Progress Notes (Signed)
Pt remained unresponsive and makes lots of gargling noises suctioned, mouth care done and medicated for comfort

## 2018-01-06 NOTE — Death Summary Note (Signed)
DEATH SUMMARY   Patient Details  Name: Sheri Shepherd MRN: [id number] DOB: 29-Sep-1932  Admission/Discharge Information   Admit Date:  Jan 03, 2018  Date of Death: Date of Death: 01-04-2018  Time of Death: Time of Death: September 29, 2003  Length of Stay: 1  Referring Physician: Darlyne Russian, MD   Reason(s) for Hospitalization  Acute CVA  Diagnoses  Preliminary cause of death:  Secondary Diagnoses (including complications and co-morbidities):  Principal Problem:   Ischemic stroke [facility]) Active Problems:   HTN (hypertension)   Acute lower UTI   Dementia with behavioral disturbance   Hypothyroid   Decreased responsiveness   DNR (do not resuscitate)   Palliative care by specialist   Brief Hospital Course (including significant findings, care, treatment, and services provided and events leading to death)   Brief Narrative: Patient is a 84 y.o. female is a 84 year old female with history of hypertension, recurrent UTI, dementia presented to the hospital after being found down at her assisted living facility.   CT of the head showed a suspicious left occipital infarct, she was also thought to have sepsis secondary to UTI and admitted to the hospitalist service.  See below for further details   Hospital course by problem list  Acute metabolic encephalopathy:  Felt to be secondary to sepsis due to UTI and aspiration pneumonia.  She continued to deteriorate, after discussion with family she was transition to full comfort measures.  Acute CVA:  rapidly deteriorated- was accumulating secretions in overtly aspirating.  After extensive discussion with the patient's daughter -it was decided that further workup would not change outcome.  Hence no further workup was pursued.  Patient was transition to full comfort measures.  Sepsis secondary to UTI and aspiration pneumonia: Comfort measures in effect-all antimicrobial therapy was discontinued.  Dementia with behavioral disturbance: Supportive care  only-full comfort measures in effect  Hypothyroidism: Stop Synthroid-full comfort measures in effect.  Pertinent Labs and Studies  Significant Diagnostic Studies Ct Head Wo Contrast  Result Date: January 03, 2018 CLINICAL DATA:  Altered LOC EXAM: CT HEAD WITHOUT CONTRAST TECHNIQUE: Contiguous axial images were obtained from the base of the skull through the vertex without intravenous contrast. COMPARISON:  Report 03/16/2003 FINDINGS: Brain: Hypodensity within the left parasagittal occipital lobe. No hemorrhage or intracranial mass. Marked atrophy. Moderate to marked small vessel ischemic changes of the white matter. Probable old lacunar infarcts in the basal ganglia. Prominent ventricles, felt secondary to atrophy Vascular: No hyperdense vessels.  Carotid vascular calcification Skull: No fracture Sinuses/Orbits: Mucosal thickening in the maxillary and ethmoid sinuses. Other: Small foci of soft tissue gas within the soft tissues deep to the left mandible. IMPRESSION: 1. Hypodensity within the left occipital lobe is suspicious for acute to subacute infarct. No hemorrhage or midline shift. 2. Atrophy with small vessel ischemic changes of the white matter. Electronically Signed   By: Donavan Foil M.D.   On: 01-03-2018 23:42   Dg Chest Port 1 View  Result Date: 2018-01-03 CLINICAL DATA:  Possible aspiration.  Altered mental status. EXAM: PORTABLE CHEST 1 VIEW COMPARISON:  02/17/2012 chest radiograph FINDINGS: Cardiomegaly noted. This is a mildly low volume film. There is no evidence of focal airspace disease, pulmonary edema, suspicious pulmonary nodule/mass, pleural effusion, or pneumothorax. No acute bony abnormalities are identified. IMPRESSION: Cardiomegaly without evidence of acute cardiopulmonary disease. Electronically Signed   By: Margarette Canada M.D.   On: 2018/01/03 23:53    Microbiology Recent Results (from the past 240 hour(s))  Urine culture  Status: Abnormal   Collection Time: 12/24/2017 12:13 AM   Result Value Ref Range Status   Specimen Description URINE, RANDOM  Final   Special Requests   Final    NONE Performed at [facility], [address], [place], [place] [address]    Culture >=100,000 COLONIES/mL STAPHYLOCOCCUS AUREUS (A)  Final   Report Status 12/10/2017 FINAL  Final   Organism ID, Bacteria STAPHYLOCOCCUS AUREUS (A)  Final      Susceptibility   Staphylococcus aureus - MIC*    CIPROFLOXACIN <=0.5 SENSITIVE Sensitive     GENTAMICIN <=0.5 SENSITIVE Sensitive     NITROFURANTOIN <=16 SENSITIVE Sensitive     OXACILLIN <=0.25 SENSITIVE Sensitive     TETRACYCLINE <=1 SENSITIVE Sensitive     VANCOMYCIN <=0.5 SENSITIVE Sensitive     TRIMETH/SULFA <=10 SENSITIVE Sensitive     CLINDAMYCIN <=0.25 SENSITIVE Sensitive     RIFAMPIN <=0.5 SENSITIVE Sensitive     Inducible Clindamycin NEGATIVE Sensitive     * >=100,000 COLONIES/mL STAPHYLOCOCCUS AUREUS  Blood Culture (routine x 2)     Status: None   Collection Time: 2017-12-24  1:10 AM  Result Value Ref Range Status   Specimen Description BLOOD RIGHT FOREARM  Final   Special Requests   Final    BOTTLES DRAWN AEROBIC AND ANAEROBIC Blood Culture adequate volume   Culture   Final    NO GROWTH 5 DAYS Performed at [facility], [place] [address] [place], [place] [address]    Report Status 12/13/2017 FINAL  Final  Blood Culture (routine x 2)     Status: None   Collection Time: 12-24-17  1:15 AM  Result Value Ref Range Status   Specimen Description BLOOD RIGHT HAND  Final   Special Requests   Final    BOTTLES DRAWN AEROBIC AND ANAEROBIC Blood Culture adequate volume   Culture   Final    NO GROWTH 5 DAYS Performed at [facility], [place] [address] [place], [place] [address]    Report Status 12/13/2017 FINAL  Final  MRSA PCR Screening     Status: None   Collection Time: 2017/12/24  6:44 AM  Result Value Ref Range Status   MRSA by PCR NEGATIVE NEGATIVE Final    Comment:        The GeneXpert MRSA Assay (FDA approved  for NASAL specimens only), is one component of a comprehensive MRSA colonization surveillance program. It is not intended to diagnose MRSA infection nor to guide or monitor treatment for MRSA infections. Performed at [facility], [place] [address], [place], [place] [address]     Lab Basic Metabolic Panel: No results for input(s): NA, K, CL, CO2, GLUCOSE, BUN, CREATININE, CALCIUM, MG, PHOS in the last 168 hours. Liver Function Tests: No results for input(s): AST, ALT, ALKPHOS, BILITOT, PROT, ALBUMIN in the last 168 hours. No results for input(s): LIPASE, AMYLASE in the last 168 hours. No results for input(s): AMMONIA in the last 168 hours. CBC: No results for input(s): WBC, NEUTROABS, HGB, HCT, MCV, PLT in the last 168 hours. Cardiac Enzymes: No results for input(s): CKTOTAL, CKMB, CKMBINDEX, TROPONINI in the last 168 hours. Sepsis Labs: No results for input(s): PROCALCITON, WBC, LATICACIDVEN in the last 168 hours.  Procedures/Operations     Shanker Ghimire 12/17/2017, 7:10 AM

## 2018-01-06 NOTE — Consult Note (Signed)
Consultation Note Date: Jan 03, 2018   Patient Name: Sheri Shepherd  DOB: 17-Jul-1932  MRN: [id number]  Age / Sex: 84 y.o., female  PCP: Sheri Russian, MD Referring Physician: Jonetta Osgood, MD  Reason for Consultation: Establishing goals of care and Psychosocial/spiritual support  HPI/Patient Profile: 84 y.o. female   admitted on 12/23/2017 with hypertension, recurrent UTI, anxiety, and dementia , significant hearing deficits,  now presenting to the emergency department after being found with decreased  responsiveness at her memory care facility.  Patient was reportedly placed in her bed, but later found on the floor and not responding.  Apparently CPR was started, chest compressions were initiated without a pulse check, and stopped when the patient began to grunt and move.   Per the daughter the patient has had continued cognitive decline over the past several months, she is recently been moved to a memory care unit.    ED Course: Upon arrival to the ED, patient is found to be afebrile, saturating mid 90s on nonrebreather, mildly tachypneic and tachycardic, and with stable blood pressure.  EKG features a sinus tachycardia with rate 114, RBBB, and LP FB.  Chest x-ray is notable for cardiomegaly but no acute cardiopulmonary disease.  Noncontrast head CT reveals a hypodensity in the left occipital lobe suspicious for acute to subacute infarct without hemorrhage or midline shift.   CBC is notable for leukocytosis to 12,600.  Troponin is undetectable.   Urinalysis is suggestive of infection and lactic acid is elevated to 3.27.  Blood and urine cultures were collected, 30 cc/kg IV fluid bolused, and empiric broad-spectrum antibiotics initiated.    Patient will be admitted for ongoing evaluation and management of stupor suspected secondary to ischemic stroke.  Family face treatment option decisions, advanced directive  decisions, and anticipatory care needs.  Clinical Assessment and Goals of Care:    This NP Sheri Shepherd reviewed medical records, received report from team, assessed the patient,and then meet at the patient's bedside along with her daughter Sheri Shepherd to introduced the concept of Palliative Medicine into a holistic treatemtn plan and to  discuss diagnosis, prognosis, GOC, EOL wishes disposition and options.  Daughter informed me that she is not interested in further diagnostics.  She has a sense of "knowing" and is at peace with her mother's morality and understands time is limited.  She shared a brief life review of her mother.  She was one of eleven children.  It was mostly  mother and daughter, after patient divorced.  They had an amazing relationship  and summed it up her mother was "kind, honest and gentle"  Main focus of care is comfort, quality and dignity.  A detailed discussion was had today regarding advanced directives.  Concepts specific to code status, artifical feeding and hydration, continued IV antibiotics and rehospitalization was had.  The difference between a aggressive medical intervention path  and a palliative comfort care path for this patient at this time was had.  Values and goals of care important to patient and family  were attempted to be elicited.  Hard choices booklet left for review.  Emotional support offerd  Natural trajectory and expectations at EOL were discussed.  Questions and concerns addressed.   Family encouraged to call with questions or concerns.    PMT will continue to support holistically.    HCPOA/daughter    SUMMARY OF RECOMMENDATIONS    Code Status/Advance Care Planning:  DNR    Symptom Management:   Pain/Dyspnea: Morphine 2 mg IV every 1 hr prn  Agitation: Ativan 1 mg IV every 4 hrs prn  Terminal secretions: Rubinol  0.4 mg every 6 hours scheduled  Palliative Prophylaxis:   Aspiration, Bowel Regimen, Delirium Protocol, Frequent  Pain Assessment and Oral Care  Additional Recommendations (Limitations, Scope, Preferences):  Full Comfort Care  Psycho-social/Spiritual:   Desire for further Chaplaincy support:declined, I did phone her friend Sheri Shepherd/chaplain at [facility] and informed her of the situation  Additional Recommendations: Grief/Bereavement Support  Prognosis:   Hours - Days  Discharge Planning: To Be Determined      Primary Diagnoses: Present on Admission: . Acute lower UTI . Ischemic stroke ([name]) . HTN (hypertension) . Dementia with behavioral disturbance . Hypothyroid   I have reviewed the medical record, interviewed the patient and family, and examined the patient. The following aspects are pertinent.  Past Medical History:  Diagnosis Date  . Anxiety   . Back pain   . Bronchitis    hx of  . Bruises easily   . Cancer ([place])    left Breast   . Diverticulosis   . Frequency of urination   . Hearing loss    bilateral ear  . History of colonic polyps   . Hx of thyroid nodule   . Hypertension   . Hypothyroid   . Memory loss   . PONV (postoperative nausea and vomiting)    Pt is "groggy" a while after surgery   . Vertigo    Social History   Socioeconomic History  . Marital status: Divorced    Spouse name: Not on file  . Number of children: Not on file  . Years of education: Not on file  . Highest education level: Not on file  Occupational History  . Not on file  Social Needs  . Financial resource strain: Not on file  . Food insecurity:    Worry: Not on file    Inability: Not on file  . Transportation needs:    Medical: Not on file    Non-medical: Not on file  Tobacco Use  . Smoking status: Never Smoker  . Smokeless tobacco: Never Used  Substance and Sexual Activity  . Alcohol use: No  . Drug use: No  . Sexual activity: Never    Birth control/protection: Post-menopausal  Lifestyle  . Physical activity:    Days per week: Not on file    Minutes per session: Not on  file  . Stress: Not on file  Relationships  . Social connections:    Talks on phone: Not on file    Gets together: Not on file    Attends religious service: Not on file    Active member of club or organization: Not on file    Attends meetings of clubs or organizations: Not on file    Relationship status: Not on file  Other Topics Concern  . Not on file  Social History Narrative  . Not on file   Family History  Problem Relation Age of Onset  . Healthy Daughter  Scheduled Meds: . aspirin  300 mg Rectal Daily   Or  . aspirin  325 mg Oral Daily  . enoxaparin (LOVENOX) injection  40 mg Subcutaneous Q24H  . glycopyrrolate  0.2 mg Intravenous TID   Continuous Infusions: . sodium chloride 100 mL/hr at 05-Jan-2018 0434  . aztreonam    . aztreonam    . famotidine (PEPCID) IV    . levofloxacin    . [START ON 12/10/2017] levofloxacin (LEVAQUIN) IV     PRN Meds:.acetaminophen **OR** acetaminophen (TYLENOL) oral liquid 160 mg/5 mL **OR** acetaminophen, fentaNYL (SUBLIMAZE) injection, LORazepam Medications Prior to Admission:  Prior to Admission medications   Medication Sig Start Date End Date Taking? Authorizing Provider  acetaminophen (MAPAP) 500 MG tablet Take 500 mg by mouth See admin instructions. Take 500 mg by mouth every four hours as needed for headache, minor discomfort, or fever of 99.5-101F/call MD if above 101F; not to exceed 2,000 mg in a day   Yes [provider]  alum & mag hydroxide-simeth (MINTOX) 200-200-20 MG/5ML suspension Take 30 mLs by mouth as needed (for heartburn or indigestion; not to exceed 4 doses in 24 hours).   Yes [provider]  amLODipine (NORVASC) 2.5 MG tablet Take 2.5 mg by mouth at bedtime.   Yes [provider]  aspirin EC 81 MG tablet Take 81 mg by mouth daily.   Yes [provider]  calcium carbonate (CAL-GEST ANTACID) 500 MG chewable tablet Chew 2 tablets by mouth 3 (three) times daily as needed for indigestion.    Yes [provider]  divalproex (DEPAKOTE SPRINKLE) 125 MG capsule Take 125 mg by mouth daily. Take 125 mg by mouth once a day at 2 PM   Yes [provider]  guaifenesin (ROBAFEN) 100 MG/5ML syrup Take 200 mg by mouth every 6 (six) hours as needed (for cough; not to exceed 4 doses in 24 hours).   Yes [provider]  levothyroxine (SYNTHROID, LEVOTHROID) 112 MCG tablet Take 112 mcg by mouth daily before breakfast.   Yes [provider]  loperamide (IMODIUM) 2 MG capsule Take 2 mg by mouth See admin instructions. Take 2 mg by mouth with each loose stool as needed for diarrhea; not to exceed 8 doses in 24 hours   Yes [provider]  LORazepam (ATIVAN) 0.5 MG tablet TAKE 1/2 TABLET BY MOUTH AT NIGHT Patient taking differently: Take 0.5 mg by mouth See admin instructions. Take 0.5 mg by mouth in the morning on Mon/Wed/Fri and 0.5 mg every day at 8 PM AND may also take an additional 0.5 mg every eight hours as needed for anxiety 04/25/15  Yes Daub, Loura Back, MD  magnesium hydroxide (MILK OF MAGNESIA) 400 MG/5ML suspension Take 30 mLs by mouth at bedtime as needed for mild constipation.   Yes [provider]  Multiple Vitamin (DAILY-VITE) TABS Take 1 tablet by mouth daily.   Yes [provider]  Neomycin-Bacitracin-Polymyxin (TRIPLE ANTIBIOTIC) [phone] OINT Apply 1 application topically See admin instructions. Apply as directed for skin tears or abrasions: Clean area with normal saline, apply ointment, cover with band-aid or gauze, and tape. Change as needed until healed.   Yes [provider]  permethrin (ELIMITE) 5 % cream Apply 1 application topically See admin instructions. Apply 1 application on the skin as directed; apply to skin and leave on for 8 hours, then rinse thoroughly in shower   Yes [provider]  ramipril (ALTACE) 10 MG capsule TAKE 1 CAPSULE BY  MOUTH EVERY DAY 04/16/15  Yes Sheri Russian, MD    HYDROcodone-acetaminophen (NORCO/VICODIN) 5-325 MG tablet One half to one tablet every 8-12 hours as needed for pain Patient not taking: Reported on December 27, 2017 01/28/16   Sheri Russian, MD  Multiple Vitamin (MULTIVITAMIN WITH MINERALS) TABS Take 1 tablet by mouth daily.    [provider]  ranitidine (ZANTAC) 150 MG capsule Take 1 capsule (150 mg total) by mouth 2 (two) times daily as needed for heartburn. Patient not taking: Reported on 12-27-17 05/21/16   Domenic Moras, PA-C   Allergies  Allergen Reactions  . Lyrica [Pregabalin] Other (See Comments)    (Already present in Epic, but not noted on MAR from [facility], however??)  . Penicillins Other (See Comments)    Heart palpitations (Already present in Epic, but not noted on [facility] from [facility], however??) Has patient had a PCN reaction causing immediate rash, facial/tongue/throat swelling, SOB or lightheadedness with hypotension: Unk Has patient had a PCN reaction causing severe rash involving mucus membranes or skin necrosis: Unk Has patient had a PCN reaction that required hospitalization: Unk Has patient had a PCN reaction occurring within the last 10 years: Unk If all of the above answers are "NO",   . Prednisone Other (See Comments)    Elevated heart rate (Already present in Epic, but not noted on [facility] from [facility], however??)  . Tramadol Other (See Comments)    Severe mood swings and confusion (Already present in Epic, but not noted on [facility] from [facility], however??)  . Sulfa Antibiotics Rash    Red, swollen areas, as well (Already present in Epic, but not noted on MAR from [facility], however??)    Review of Systems  Unable to perform ROS: Acuity of condition    Physical Exam  Constitutional: She appears well-developed. She appears ill. Face mask in place.  HENT:  -audible throat secretions  Cardiovascular: Tachycardia present.  Pulmonary/Chest: She has decreased breath sounds.  - periods of  apnea/ 30 second pauses  Neurological: She is unresponsive.  Skin:  -feet are cool and mottled    Vital Signs: BP (!) 118/52 (BP Location: Right Arm)   Pulse 78   Temp 97.7 F (36.5 C) (Oral)   Resp 12   Ht 5\' 2"  (1.575 m)   Wt 61.7 kg (136 lb 0.4 oz)   SpO2 100%   BMI 24.88 kg/m  Pain Scale: 0-10   Pain Score: 0-No pain   SpO2: SpO2: 100 % O2 Device:SpO2: 100 % O2 Flow Rate: .   IO: Intake/output summary:   Intake/Output Summary (Last 24 hours) at 12/27/2017 0846 Last data filed at 2017/12/27 0700 Gross per 24 hour  Intake 700 ml  Output 0 ml  Net 700 ml    LBM:   Baseline Weight: Weight: 61.7 kg (136 lb 0.4 oz) Most recent weight: Weight: 61.7 kg (136 lb 0.4 oz)      Palliative Assessment/Data:  10%   Discussed with Dr Sloan Leiter  Time In: 0900 Time Out: 1015 Time Total: 75 minutes Greater than 50%  of this time was spent counseling and coordinating care related to the above assessment and plan.  Signed by: Sheri Lessen, NP   Please contact Palliative Medicine Team phone at [phone] for questions and concerns.  For individual provider: See Shea Evans

## 2018-01-06 NOTE — Progress Notes (Signed)
Pharmacy Antibiotic Note  Sheri Shepherd is a 84 y.o. female admitted on 12/09/2017 with sepsis secondary to UTI.  Pharmacy has been consulted for Levaquin and Azactam dosing.  Plan: Levaquin 750mg  IV Q48H. Azavtam 2g x1 then 1g IV Q8H.  Height: 5\' 2"  (157.5 cm) Weight: 136 lb 0.4 oz (61.7 kg) IBW/kg (Calculated) : 50.1  Temp (24hrs), Avg:98 F (36.7 C), Min:97.4 F (36.3 C), Max:98.6 F (37 C)  Recent Labs  Lab 01/02/2018 2319 12/18/2017 2324 12/07/2017 2353  WBC 12.6*  --   --   CREATININE 0.89  --   --   LATICACIDVEN  --  3.27* 3.6*    Estimated Creatinine Clearance: 42.1 mL/min (by C-G formula based on SCr of 0.89 mg/dL).    Allergies  Allergen Reactions  . Lyrica [Pregabalin] Other (See Comments)    (Already present in Epic, but not noted on MAR from [facility], however??)  . Penicillins Other (See Comments)    Heart palpitations (Already present in Epic, but not noted on [facility] from [facility], however??) Has patient had a PCN reaction causing immediate rash, facial/tongue/throat swelling, SOB or lightheadedness with hypotension: Unk Has patient had a PCN reaction causing severe rash involving mucus membranes or skin necrosis: Unk Has patient had a PCN reaction that required hospitalization: Unk Has patient had a PCN reaction occurring within the last 10 years: Unk If all of the above answers are "NO",   . Prednisone Other (See Comments)    Elevated heart rate (Already present in Epic, but not noted on [facility] from [facility], however??)  . Tramadol Other (See Comments)    Severe mood swings and confusion (Already present in Epic, but not noted on [facility] from [facility], however??)  . Sulfa Antibiotics Rash    Red, swollen areas, as well (Already present in Epic, but not noted on MAR from [facility], however??)      Thank you for allowing pharmacy to be a part of this patient's care.  Wynona Neat, PharmD, BCPS  Dec 28, 2017 3:58 AM

## 2018-01-06 NOTE — Plan of Care (Signed)
Informed by Dr. Sloan Leiter and palliative care NP Stanton Kidney that pt is not doing well and she has been followed by palliative care in the past. After discussion with daughter by Dr. Sloan Leiter and Stanton Kidney, daughter requested comfort care given current severe condition and poor prognosis. We will sign off at this time and please call us back if further questions. Thanks for the consult.  Sheri Hawking, MD PhD Stroke Neurology 01-01-2018 9:20 AM

## 2018-01-06 NOTE — H&P (Addendum)
History and Physical    HADDIE BRUHL YCX:[id number] DOB: 07-25-1932 DOA: 12/14/2017  PCP: Darlyne Russian, MD   Patient coming from: SNF   Chief Complaint: Poorly responsive   HPI: Sheri Shepherd is a 84 y.o. female with medical history significant for hypertension, recurrent UTI, anxiety, and dementia with behavioral disturbance, now presenting to the emergency department after being found poorly responsive at her nursing facility.  History is obtained through discussion with the patient's daughter, report of nursing home personnel, and review of EMR.  Patient has had progression in her dementia over the past several months and recently moved from assisted living to SNF.  She had otherwise been stable, holding a conversation with her daughter 2 days ago.  She was reportedly incontinent yesterday evening and too weak to ambulate to the bathroom on her own, requiring a wheelchair; this is very uncharacteristic for her per report of her daughter.  Patient was reportedly placed in her bed, but later found on the floor and not responding.  Chest compressions were initiated with out a pulse check, and stopped when the patient began to grunt and move.  She had not been expressing any complaints per report of family.  ED Course: Upon arrival to the ED, patient is found to be afebrile, saturating mid 90s on nonrebreather, mildly tachypneic and tachycardic, and with stable blood pressure.  EKG features a sinus tachycardia with rate 114, RBBB, and LP FB.  Chest x-ray is notable for cardiomegaly but no acute cardiopulmonary disease.  Noncontrast head CT reveals a hypodensity in the left occipital lobe suspicious for acute to subacute infarct without hemorrhage or midline shift.  Chemistry panel reveals a hyperglycemia and slight hyponatremia.  CBC is notable for leukocytosis to 12,600.  Troponin is undetectable.  Urinalysis is suggestive of infection and lactic acid is elevated to 3.27.  Blood and urine  cultures were collected, 30 cc/kg IV fluid bolused, and empiric broad-spectrum antibiotics initiated.  Neurology was consulted by the ED physician.  Patient will be admitted for ongoing evaluation and management of stupor suspected secondary to ischemic stroke.  Review of Systems:  All other systems reviewed and apart from HPI, are negative.  Past Medical History:  Diagnosis Date  . Anxiety   . Back pain   . Bronchitis    hx of  . Bruises easily   . Cancer ([place])    left Breast   . Diverticulosis   . Frequency of urination   . Hearing loss    bilateral ear  . History of colonic polyps   . Hx of thyroid nodule   . Hypertension   . Hypothyroid   . Memory loss   . PONV (postoperative nausea and vomiting)    Pt is "groggy" a while after surgery   . Vertigo     Past Surgical History:  Procedure Laterality Date  . ABDOMINAL HYSTERECTOMY  1978  . APPENDECTOMY    . CATARACT EXTRACTION, BILATERAL    . CERVICAL FUSION     x2  . colonoscopy    . EYE SURGERY    . MASTECTOMY  1999   Left  . POSTERIOR CERVICAL FUSION/FORAMINOTOMY  02/18/2012   Procedure: POSTERIOR CERVICAL FUSION/FORAMINOTOMY LEVEL 4;  Surgeon: Erline Levine, MD;  Location: [facility];  Service: Neurosurgery;  Laterality: N/A;  Cervical four - seven  Posterior cervical decompression/fusion  . THYROIDECTOMY, PARTIAL    . TONSILLECTOMY       reports that she has never smoked.  She has never used smokeless tobacco. She reports that she does not drink alcohol or use drugs.  Allergies  Allergen Reactions  . Lyrica [Pregabalin] Other (See Comments)    (Already present in Epic, but not noted on MAR from [facility], however??)  . Penicillins Other (See Comments)    Heart palpitations (Already present in Epic, but not noted on [facility] from [facility], however??) Has patient had a PCN reaction causing immediate rash, facial/tongue/throat swelling, SOB or lightheadedness with hypotension: Unk Has patient had a PCN  reaction causing severe rash involving mucus membranes or skin necrosis: Unk Has patient had a PCN reaction that required hospitalization: Unk Has patient had a PCN reaction occurring within the last 10 years: Unk If all of the above answers are "NO",   . Prednisone Other (See Comments)    Elevated heart rate (Already present in Epic, but not noted on [facility] from [facility], however??)  . Tramadol Other (See Comments)    Severe mood swings and confusion (Already present in Epic, but not noted on [facility] from [facility], however??)  . Sulfa Antibiotics Rash    Red, swollen areas, as well (Already present in Epic, but not noted on MAR from [facility], however??)     Family History  Problem Relation Age of Onset  . Healthy Daughter      Prior to Admission medications   Medication Sig Start Date End Date Taking? Authorizing Provider  acetaminophen (MAPAP) 500 MG tablet Take 500 mg by mouth See admin instructions. Take 500 mg by mouth every four hours as needed for headache, minor discomfort, or fever of 99.5-101F/call MD if above 101F; not to exceed 2,000 mg in a day   Yes [provider]  alum & mag hydroxide-simeth (MINTOX) 200-200-20 MG/5ML suspension Take 30 mLs by mouth as needed (for heartburn or indigestion; not to exceed 4 doses in 24 hours).   Yes [provider]  amLODipine (NORVASC) 2.5 MG tablet Take 2.5 mg by mouth at bedtime.   Yes [provider]  aspirin EC 81 MG tablet Take 81 mg by mouth daily.   Yes [provider]  calcium carbonate (CAL-GEST ANTACID) 500 MG chewable tablet Chew 2 tablets by mouth 3 (three) times daily as needed for indigestion.   Yes [provider]  divalproex (DEPAKOTE SPRINKLE) 125 MG capsule Take 125 mg by mouth daily. Take 125 mg by mouth once a day at 2 PM   Yes [provider]  guaifenesin (ROBAFEN) 100 MG/5ML syrup Take 200 mg by mouth every 6 (six) hours as needed (for cough; not to exceed  4 doses in 24 hours).   Yes [provider]  levothyroxine (SYNTHROID, LEVOTHROID) 112 MCG tablet Take 112 mcg by mouth daily before breakfast.   Yes [provider]  loperamide (IMODIUM) 2 MG capsule Take 2 mg by mouth See admin instructions. Take 2 mg by mouth with each loose stool as needed for diarrhea; not to exceed 8 doses in 24 hours   Yes [provider]  LORazepam (ATIVAN) 0.5 MG tablet TAKE 1/2 TABLET BY MOUTH AT NIGHT Patient taking differently: Take 0.5 mg by mouth See admin instructions. Take 0.5 mg by mouth in the morning on Mon/Wed/Fri and 0.5 mg every day at 8 PM AND may also take an additional 0.5 mg every eight hours as needed for anxiety 04/25/15  Yes Daub, Loura Back, MD  magnesium hydroxide (MILK OF MAGNESIA) 400 MG/5ML suspension Take 30 mLs  by mouth at bedtime as needed for mild constipation.   Yes [provider]  Multiple Vitamin (DAILY-VITE) TABS Take 1 tablet by mouth daily.   Yes [provider]  Neomycin-Bacitracin-Polymyxin (TRIPLE ANTIBIOTIC) [phone] OINT Apply 1 application topically See admin instructions. Apply as directed for skin tears or abrasions: Clean area with normal saline, apply ointment, cover with band-aid or gauze, and tape. Change as needed until healed.   Yes [provider]  permethrin (ELIMITE) 5 % cream Apply 1 application topically See admin instructions. Apply 1 application on the skin as directed; apply to skin and leave on for 8 hours, then rinse thoroughly in shower   Yes [provider]  ramipril (ALTACE) 10 MG capsule TAKE 1 CAPSULE BY MOUTH EVERY DAY 04/16/15  Yes Daub, Loura Back, MD  HYDROcodone-acetaminophen (NORCO/VICODIN) 5-325 MG tablet One half to one tablet every 8-12 hours as needed for pain Patient not taking: Reported on December 28, 2017 01/28/16   Darlyne Russian, MD  Multiple Vitamin (MULTIVITAMIN WITH MINERALS) TABS Take 1 tablet by mouth daily.    [provider]    ranitidine (ZANTAC) 150 MG capsule Take 1 capsule (150 mg total) by mouth 2 (two) times daily as needed for heartburn. Patient not taking: Reported on Dec 28, 2017 05/21/16   Domenic Moras, PA-C    Physical Exam: Vitals:   2017-12-28 0115 2017/12/28 0145 Dec 28, 2017 0200 12-28-17 0215  BP: (!) 117/44 (!) 157/57 (!) 154/59 (!) 123/48  Pulse: 91 99 98 92  Resp: 12 20 18 17   Temp:      TempSrc:      SpO2: 93% 97% 99% 99%  Height:   5\' 2"  (1.575 m)       Constitutional: Stupor, no cyanosis, no diaphoresis Eyes: PERTLA, lids and conjunctivae normal ENMT: Mucous membranes are moist. Posterior pharynx clear of any exudate or lesions.   Neck: normal, no masses, no thyromegaly Respiratory: clear to auscultation bilaterally. Normal respiratory effort. No accessory muscle use.  Cardiovascular: S1 & S2 heard, regular rate and rhythm. No significant JVD. Abdomen: No distension, soft. Bowel sounds activel.  Musculoskeletal: no clubbing / cyanosis. No joint deformity upper and lower extremities.    Skin: no significant rashes, lesions, ulcers. Warm, dry, well-perfused. Neurologic: Not speaking or following commands. Pupils round and equal, not deviated. Negative oculocephalic reflex. Patellar DTRs intact. No spontaneous limb movements.     Labs on Admission: I have personally reviewed following labs and imaging studies  CBC: Recent Labs  Lab 12/16/2017 2319  WBC 12.6*  NEUTROABS 11.2*  HGB 13.1  HCT 41.9  MCV 79.5  PLT 161   Basic Metabolic Panel: Recent Labs  Lab 12/19/2017 2319  NA 134*  K 3.9  CL 100  CO2 21*  GLUCOSE 248*  BUN 9  CREATININE 0.89  CALCIUM 9.1   GFR: CrCl cannot be calculated (Unknown ideal weight.). Liver Function Tests: Recent Labs  Lab 01/05/2018 2319  AST 28  ALT 15  ALKPHOS 109  BILITOT 0.7  PROT 7.2  ALBUMIN 3.5   No results for input(s): LIPASE, AMYLASE in the last 168 hours. Recent Labs  Lab 12/14/2017 2353  AMMONIA 14   Coagulation  Profile: Recent Labs  Lab 12/06/2017 2319  INR 1.06   Cardiac Enzymes: Recent Labs  Lab 01/02/2018 2359  TROPONINI <0.03   BNP (last 3 results) No results for input(s): PROBNP in the last 8760 hours. HbA1C: No results for input(s): HGBA1C in the last 72 hours. CBG: Recent Labs  Lab 12/17/2017 2318  GLUCAP 254*   Lipid Profile: No results for input(s): CHOL, HDL, LDLCALC, TRIG, CHOLHDL, LDLDIRECT in the last 72 hours. Thyroid Function Tests: No results for input(s): TSH, T4TOTAL, FREET4, T3FREE, THYROIDAB in the last 72 hours. Anemia Panel: No results for input(s): VITAMINB12, FOLATE, FERRITIN, TIBC, IRON, RETICCTPCT in the last 72 hours. Urine analysis:    Component Value Date/Time   COLORURINE YELLOW 2017/12/15 0013   APPEARANCEUR CLOUDY (A) 12-15-2017 0013   LABSPEC 1.012 12-15-17 0013   PHURINE 7.0 Dec 15, 2017 0013   GLUCOSEU >=500 (A) 12/15/17 0013   HGBUR NEGATIVE 12/15/2017 0013   BILIRUBINUR NEGATIVE 15-Dec-2017 0013   BILIRUBINUR neg 11/21/2013 1618   KETONESUR 20 (A) December 15, 2017 0013   PROTEINUR NEGATIVE 12-15-17 0013   UROBILINOGEN 0.2 11/21/2013 1618   UROBILINOGEN 1.0 01/14/2012 0804   NITRITE POSITIVE (A) 2017/12/15 0013   LEUKOCYTESUR LARGE (A) 12-15-2017 0013   Sepsis Labs: @LABRCNTIP (procalcitonin:4,lacticidven:4) )No results found for this or any previous visit (from the past 240 hour(s)).   Radiological Exams on Admission: Ct Head Wo Contrast  Result Date: 12/31/2017 CLINICAL DATA:  Altered LOC EXAM: CT HEAD WITHOUT CONTRAST TECHNIQUE: Contiguous axial images were obtained from the base of the skull through the vertex without intravenous contrast. COMPARISON:  Report 03/16/2003 FINDINGS: Brain: Hypodensity within the left parasagittal occipital lobe. No hemorrhage or intracranial mass. Marked atrophy. Moderate to marked small vessel ischemic changes of the white matter. Probable old lacunar infarcts in the basal ganglia. Prominent ventricles, felt  secondary to atrophy Vascular: No hyperdense vessels.  Carotid vascular calcification Skull: No fracture Sinuses/Orbits: Mucosal thickening in the maxillary and ethmoid sinuses. Other: Small foci of soft tissue gas within the soft tissues deep to the left mandible. IMPRESSION: 1. Hypodensity within the left occipital lobe is suspicious for acute to subacute infarct. No hemorrhage or midline shift. 2. Atrophy with small vessel ischemic changes of the white matter. Electronically Signed   By: Donavan Foil M.D.   On: 12/20/2017 23:42   Dg Chest Port 1 View  Result Date: 12/18/2017 CLINICAL DATA:  Possible aspiration.  Altered mental status. EXAM: PORTABLE CHEST 1 VIEW COMPARISON:  02/17/2012 chest radiograph FINDINGS: Cardiomegaly noted. This is a mildly low volume film. There is no evidence of focal airspace disease, pulmonary edema, suspicious pulmonary nodule/mass, pleural effusion, or pneumothorax. No acute bony abnormalities are identified. IMPRESSION: Cardiomegaly without evidence of acute cardiopulmonary disease. Electronically Signed   By: Margarette Canada M.D.   On: 01/05/2018 23:53    EKG: Independently reviewed. Sinus tachycardia (rate 114), RBBB, LPFB.   Assessment/Plan  1. Ischemic CVA with stupor  - Presents from SNF with stupor, found to have UTI and hypodensity in left occipital lobe on CT concerning for acute or subacute CVA without hemorrhage or midline shift  - Neurology is consulting and much appreciated  - tPA was not given d/t presentation outside the timeframe  - MRI brain and echocardiogram ordered; will follow-up on neuro recs regarding additional workup  - Continue cardiac monitoring, frequent neuro checks, PT/OT/SLP evals  - Continue supportive care    2. Sepsis secondary to UTI  - Presents poorly-responsive, likely secondary to #1, but also found to have leukocytosis, elevated lactate, and UA suggestive of infection  - Blood and urine cultures collected in ED, 30 cc/kg IVF  bolused, and empiric broad-spectrum abx initiated in ED  - Continue empiric abx while following cultures and clinical course    3. Dementia with behavior disturbance  -  Managed with Depakote qHS, held initially d/t stupor   4. Anxiety  - Managed with low-dose Ativan at the nursing home, will continue    5. Hypothyroidism  - Synthroid held initially with plan to resume as her condition allows    DVT prophylaxis: Lovenox  Code Status: DNR  Family Communication: Daughter, [name], updated at bedside  Consults called: Neurology  Admission status: Inpatient     Vianne Bulls, MD [facility] Pager [phone]  If 7PM-7AM, please contact night-coverage [email or web address] Password [facility]  16-Dec-2017, 2:37 AM

## 2018-01-06 NOTE — ED Notes (Signed)
MD Wickline notified about pt bradypneic and unresponsive after Narcan.  Per MD Wickline place nonrebreather on at 15 L due to pt being a DNR and not intubating at this time per daughter request. Will continue to monitor.

## 2018-01-06 NOTE — Progress Notes (Signed)
Patient passed away at 2005  All personal belongings with patient's daughter.

## 2018-01-06 NOTE — Progress Notes (Addendum)
OT Cancellation Note  Patient Details Name: Sheri Shepherd MRN: [id number] DOB: 09-Dec-1932   Cancelled Treatment:    Reason Eval/Treat Not Completed: Medical issues which prohibited therapy; spoke with RN, pt nonresponsive at this time, may be going comfort care (awaiting MRI results). Will follow.  Of note pt now noted to be on comfort care. OT to sign off at this time. Please re-order if needs change.   Lou Cal, OT Pager [phone] 01/06/18  Raymondo Band 06-Jan-2018, 9:00 AM

## 2018-01-06 NOTE — ED Notes (Signed)
Report given to Esther.

## 2018-01-06 NOTE — Progress Notes (Signed)
PT Cancellation Note  Patient Details Name: Sheri Shepherd MRN: [id number] DOB: 1933/05/29   Cancelled Treatment:    Reason Eval/Treat Not Completed: Medical issues which prohibited therapy Spoke with RN - patient currently nonresponsive, reports family may be considering comfort care. Will follow.  Lanney Gins, PT, DPT January 07, 2018 9:28 AM

## 2018-01-06 NOTE — Progress Notes (Signed)
Pt received from [facility] ED via stretcher with monitor and non re breather mask 100 % on. Pt non verval moans to painful stimuli only. Rt pupil 2 mm  fixed non reactive and left pupils 3 mm sluggish to light;.  Initial assessment done skin intact no breakdown except varicose veins to legs. Cardiac monitor in use verification with CMT completed, S R rate 60  Safety precaution and care of plan explained to pt and daughter. And daughter verbalized understanding.  Pt mouth care done and puwuick external cath replace.  Physical and emotional support given to pt and daughter Rae Halsted at bed side. Call bell within reach. RN will continue to monitor pt closely.

## 2018-01-06 DEATH — deceased
